# Patient Record
Sex: Male | Born: 1975 | Race: White | Hispanic: No | Marital: Married | Smoking: Former smoker
Health system: Southern US, Community
[De-identification: ages and names within clinical notes are randomized; demographics above are authoritative.]

## PROBLEM LIST (undated history)

## (undated) DIAGNOSIS — K200 Eosinophilic esophagitis: Secondary | ICD-10-CM

## (undated) DIAGNOSIS — B019 Varicella without complication: Secondary | ICD-10-CM

## (undated) DIAGNOSIS — T7840XA Allergy, unspecified, initial encounter: Secondary | ICD-10-CM

## (undated) DIAGNOSIS — K219 Gastro-esophageal reflux disease without esophagitis: Secondary | ICD-10-CM

## (undated) DIAGNOSIS — J302 Other seasonal allergic rhinitis: Secondary | ICD-10-CM

## (undated) DIAGNOSIS — K635 Polyp of colon: Secondary | ICD-10-CM

## (undated) HISTORY — PX: WISDOM TOOTH EXTRACTION: SHX21

## (undated) HISTORY — DX: Gastro-esophageal reflux disease without esophagitis: K21.9

## (undated) HISTORY — DX: Varicella without complication: B01.9

## (undated) HISTORY — DX: Other seasonal allergic rhinitis: J30.2

## (undated) HISTORY — PX: OTHER SURGICAL HISTORY: SHX169

## (undated) HISTORY — DX: Eosinophilic esophagitis: K20.0

## (undated) HISTORY — DX: Polyp of colon: K63.5

## (undated) HISTORY — DX: Allergy, unspecified, initial encounter: T78.40XA

---

## 2009-08-21 HISTORY — PX: COLONOSCOPY: SHX174

## 2014-04-07 ENCOUNTER — Ambulatory Visit (INDEPENDENT_AMBULATORY_CARE_PROVIDER_SITE_OTHER): Payer: BC Managed Care – PPO | Admitting: Family Medicine

## 2014-04-07 ENCOUNTER — Encounter: Payer: Self-pay | Admitting: Family Medicine

## 2014-04-07 VITALS — BP 130/80 | HR 78 | Temp 98.1°F | Ht 70.5 in | Wt 206.0 lb

## 2014-04-07 DIAGNOSIS — L708 Other acne: Secondary | ICD-10-CM

## 2014-04-07 DIAGNOSIS — J309 Allergic rhinitis, unspecified: Secondary | ICD-10-CM

## 2014-04-07 DIAGNOSIS — J3089 Other allergic rhinitis: Secondary | ICD-10-CM | POA: Insufficient documentation

## 2014-04-07 DIAGNOSIS — L709 Acne, unspecified: Secondary | ICD-10-CM | POA: Insufficient documentation

## 2014-04-07 DIAGNOSIS — D126 Benign neoplasm of colon, unspecified: Secondary | ICD-10-CM

## 2014-04-07 DIAGNOSIS — L700 Acne vulgaris: Secondary | ICD-10-CM

## 2014-04-07 DIAGNOSIS — K635 Polyp of colon: Secondary | ICD-10-CM | POA: Insufficient documentation

## 2014-04-07 DIAGNOSIS — K219 Gastro-esophageal reflux disease without esophagitis: Secondary | ICD-10-CM

## 2014-04-07 MED ORDER — MINOCYCLINE HCL 100 MG PO CAPS: 100.0000 mg | ORAL_CAPSULE | Freq: Every day | ORAL | Status: DC

## 2014-04-07 NOTE — Progress Notes (Signed)
   Subjective:    Patient ID: Samuel Ross, male    DOB: August 01, 1976, 38 y.o.   MRN: [id number]  HPI   Patient is here to establish care. Has chronic problems include history of GERD, benign colon polyp, perennial allergies, acne. He has history of generalized alopecia and takes low-dose finasteride. His other medications include minocycline, Singulair, and Xyzal.    Generally very healthy. He has had colonoscopy 5 years ago secondary to hematochezia. This showed rarely one benign polyp. Patient quit smoking 10 years ago with 5-pack-year history. No regular alcohol use. Family history reviewed and as below  Patient has had some recent fleeting right elbow pain. No injury. Location is likely bursa. No swelling. No erythema or warmth. Full range of motion elbow. Pain is not reproducible except for palpitations occasionally  Patient complains of flat feet and has nonspecific diffuse bilateral foot pains. He is considering orthotics.  Past Medical History  Diagnosis Date  . Chicken pox   . GERD (gastroesophageal reflux disease)   . Allergy   . Colon polyps    History reviewed. No pertinent past surgical history.  reports that he has quit smoking. He does not have any smokeless tobacco history on file. He reports that he drinks alcohol. He reports that he does not use illicit drugs. family history includes Arthritis in his mother; Cancer in his paternal grandfather; Heart disease in his father, paternal grandfather, and paternal grandmother; Hyperlipidemia in his maternal grandfather. No Known Allergies    Review of Systems  Constitutional: Negative for fever, chills, appetite change and unexpected weight change.  HENT: Positive for congestion.   Eyes: Negative for visual disturbance.  Respiratory: Negative for shortness of breath.   Cardiovascular: Negative for chest pain.  Gastrointestinal: Negative for abdominal pain.  Genitourinary: Negative for dysuria.  Neurological: Negative for  weakness and numbness.       Objective:   Physical Exam  Constitutional: He appears well-developed and well-nourished.  Cardiovascular: Normal rate and regular rhythm.  Exam reveals no gallop.   No murmur heard. Pulmonary/Chest: Effort normal and breath sounds normal. No respiratory distress. He has no wheezes. He has no rales.  Musculoskeletal:  Right elbow reveals full range of motion. No erythema. No warmth. No localized tenderness. No bursa inflammation. He does not have any tenderness over the lateral or medial epicondylar region          Assessment & Plan:  #1 history of acne. Refill minocycline. Consider CBC at some point later this year #2 perennial allergies. Stable #3 GERD which is controlled with over-the-counter medication  #4 bilateral foot pain. Patient considering orthotics. #5 right elbow pain. Nonfocal exam. Pain not reproducible on exam today. Observe for now. Consider plain films if pain persists.

## 2014-04-07 NOTE — Progress Notes (Signed)
Pre visit review using our clinic review tool, if applicable. No additional management support is needed unless otherwise documented below in the visit note. 

## 2014-07-28 ENCOUNTER — Ambulatory Visit (INDEPENDENT_AMBULATORY_CARE_PROVIDER_SITE_OTHER): Payer: BC Managed Care – PPO | Admitting: Family Medicine

## 2014-07-28 ENCOUNTER — Ambulatory Visit (INDEPENDENT_AMBULATORY_CARE_PROVIDER_SITE_OTHER): Payer: BC Managed Care – PPO

## 2014-07-28 ENCOUNTER — Encounter: Payer: Self-pay | Admitting: Family Medicine

## 2014-07-28 VITALS — BP 132/80 | HR 75 | Temp 98.5°F | Wt 205.0 lb

## 2014-07-28 DIAGNOSIS — M545 Low back pain, unspecified: Secondary | ICD-10-CM

## 2014-07-28 DIAGNOSIS — Z23 Encounter for immunization: Secondary | ICD-10-CM

## 2014-07-28 MED ORDER — NABUMETONE 750 MG PO TABS: 750.0000 mg | ORAL_TABLET | Freq: Two times a day (BID) | ORAL | Status: DC | PRN

## 2014-07-28 MED ORDER — METAXALONE 800 MG PO TABS: 800.0000 mg | ORAL_TABLET | Freq: Three times a day (TID) | ORAL | Status: DC

## 2014-07-28 NOTE — Progress Notes (Signed)
   Subjective:    Patient ID: Samuel Ross, male    DOB: Jan 24, 1976, 38 y.o.   MRN: [id number]  HPI Patient seen for low back pain. Onset a few weeks ago. Denies any specific injury. Location is left lower lumbar. No radiculopathy symptoms. His pain is 9 out of 10 at its worst. Has tried Aleve without much improvement. He has pain which is sometimes sharp and sometimes dull and worse with movement and sometimes with walking. Not with sitting. No radiculopathy symptoms. No numbness or weakness. No loss of bladder or bowel control. Denies any appetite or weight changes. No fevers or chills. No dysuria.  Past Medical History  Diagnosis Date  . Chicken pox   . GERD (gastroesophageal reflux disease)   . Allergy   . Colon polyps    No past surgical history on file.  reports that he has quit smoking. He does not have any smokeless tobacco history on file. He reports that he drinks alcohol. He reports that he does not use illicit drugs. family history includes Arthritis in his mother; Cancer in his paternal grandfather; Heart disease in his father, paternal grandfather, and paternal grandmother; Hyperlipidemia in his maternal grandfather. No Known Allergies    Review of Systems  Constitutional: Negative for fever, activity change and appetite change.  Respiratory: Negative for cough and shortness of breath.   Cardiovascular: Negative for chest pain and leg swelling.  Gastrointestinal: Negative for vomiting and abdominal pain.  Endocrine: Negative for polydipsia and polyuria.  Genitourinary: Negative for dysuria, hematuria and flank pain.  Musculoskeletal: Positive for back pain. Negative for joint swelling.  Neurological: Negative for weakness and numbness.       Objective:   Physical Exam  Constitutional: He is oriented to person, place, and time. He appears well-developed and well-nourished. No distress.  Neck: No thyromegaly present.  Cardiovascular: Normal rate, regular rhythm and  normal heart sounds.   No murmur heard. Pulmonary/Chest: Effort normal and breath sounds normal. No respiratory distress. He has no wheezes. He has no rales.  Musculoskeletal: He exhibits no edema.  Straight leg raises are negative. No localized back tenderness  Neurological: He is alert and oriented to person, place, and time. He has normal reflexes. No cranial nerve deficit.  Skin: No rash noted.          Assessment & Plan:  Low back pain. Suspect musculoskeletal. Nonfocal exam. We gave him some extension stretches. Refill Skelaxin 800 mg 3 times a day when necessary and Relafen 750 milligrams twice a day when necessary. Consider physical therapy if no better in 2-3 weeks

## 2014-07-28 NOTE — Progress Notes (Signed)
Pre visit review using our clinic review tool, if applicable. No additional management support is needed unless otherwise documented below in the visit note. 

## 2014-07-28 NOTE — Patient Instructions (Signed)
Low Back Sprain with Rehab  A sprain is an injury in which a ligament is torn. The ligaments of the lower back are vulnerable to sprains. However, they are strong and require great force to be injured. These ligaments are important for stabilizing the spinal column. Sprains are classified into three categories. Grade 1 sprains cause pain, but the tendon is not lengthened. Grade 2 sprains include a lengthened ligament, due to the ligament being stretched or partially ruptured. With grade 2 sprains there is still function, although the function may be decreased. Grade 3 sprains involve a complete tear of the tendon or muscle, and function is usually impaired. SYMPTOMS   Severe pain in the lower back.  Sometimes, a feeling of a "pop," "snap," or tear, at the time of injury.  Tenderness and sometimes swelling at the injury site.  Uncommonly, bruising (contusion) within 48 hours of injury.  Muscle spasms in the back. CAUSES  Low back sprains occur when a force is placed on the ligaments that is greater than they can handle. Common causes of injury include:  Performing a stressful act while off-balance.  Repetitive stressful activities that involve movement of the lower back.  Direct hit (trauma) to the lower back. RISK INCREASES WITH:  Contact sports (football, wrestling).  Collisions (major skiing accidents).  Sports that require throwing or lifting (baseball, weightlifting).  Sports involving twisting of the spine (gymnastics, diving, tennis, golf).  Poor strength and flexibility.  Inadequate protection.  Previous back injury or surgery (especially fusion). PREVENTION  Wear properly fitted and padded protective equipment.  Warm up and stretch properly before activity.  Allow for adequate recovery between workouts.  Maintain physical fitness:  Strength, flexibility, and endurance.  Cardiovascular fitness.  Maintain a healthy body weight. PROGNOSIS  If treated  properly, low back sprains usually heal with non-surgical treatment. The length of time for healing depends on the severity of the injury.  RELATED COMPLICATIONS   Recurring symptoms, resulting in a chronic problem.  Chronic inflammation and pain in the low back.  Delayed healing or resolution of symptoms, especially if activity is resumed too soon.  Prolonged impairment.  Unstable or arthritic joints of the low back. TREATMENT  Treatment first involves the use of ice and medicine, to reduce pain and inflammation. The use of strengthening and stretching exercises may help reduce pain with activity. These exercises may be performed at home or with a therapist. Severe injuries may require referral to a therapist for further evaluation and treatment, such as ultrasound. Your caregiver may advise that you wear a back brace or corset, to help reduce pain and discomfort. Often, prolonged bed rest results in greater harm then benefit. Corticosteroid injections may be recommended. However, these should be reserved for the most serious cases. It is important to avoid using your back when lifting objects. At night, sleep on your back on a firm mattress, with a pillow placed under your knees. If non-surgical treatment is unsuccessful, surgery may be needed.  MEDICATION   If pain medicine is needed, nonsteroidal anti-inflammatory medicines (aspirin and ibuprofen), or other minor pain relievers (acetaminophen), are often advised.  Do not take pain medicine for 7 days before surgery.  Prescription pain relievers may be given, if your caregiver thinks they are needed. Use only as directed and only as much as you need.  Ointments applied to the skin may be helpful.  Corticosteroid injections may be given by your caregiver. These injections should be reserved for the most serious cases,   because they may only be given a certain number of times. HEAT AND COLD  Cold treatment (icing) should be applied for 10  to 15 minutes every 2 to 3 hours for inflammation and pain, and immediately after activity that aggravates your symptoms. Use ice packs or an ice massage.  Heat treatment may be used before performing stretching and strengthening activities prescribed by your caregiver, physical therapist, or athletic trainer. Use a heat pack or a warm water soak. SEEK MEDICAL CARE IF:   Symptoms get worse or do not improve in 2 to 4 weeks, despite treatment.  You develop numbness or weakness in either leg.  You lose bowel or bladder function.  Any of the following occur after surgery: fever, increased pain, swelling, redness, drainage of fluids, or bleeding in the affected area.  New, unexplained symptoms develop. (Drugs used in treatment may produce side effects.) EXERCISES  RANGE OF MOTION (ROM) AND STRETCHING EXERCISES - Low Back Sprain Most people with lower back pain will find that their symptoms get worse with excessive bending forward (flexion) or arching at the lower back (extension). The exercises that will help resolve your symptoms will focus on the opposite motion.  Your physician, physical therapist or athletic trainer will help you determine which exercises will be most helpful to resolve your lower back pain. Do not complete any exercises without first consulting with your caregiver. Discontinue any exercises which make your symptoms worse, until you speak to your caregiver. If you have pain, numbness or tingling which travels down into your buttocks, leg or foot, the goal of the therapy is for these symptoms to move closer to your back and eventually resolve. Sometimes, these leg symptoms will get better, but your lower back pain may worsen. This is often an indication of progress in your rehabilitation. Be very alert to any changes in your symptoms and the activities in which you participated in the 24 hours prior to the change. Sharing this information with your caregiver will allow him or her to  most efficiently treat your condition. These exercises may help you when beginning to rehabilitate your injury. Your symptoms may resolve with or without further involvement from your physician, physical therapist or athletic trainer. While completing these exercises, remember:   Restoring tissue flexibility helps normal motion to return to the joints. This allows healthier, less painful movement and activity.  An effective stretch should be held for at least 30 seconds.  A stretch should never be painful. You should only feel a gentle lengthening or release in the stretched tissue. FLEXION RANGE OF MOTION AND STRETCHING EXERCISES: STRETCH - Flexion, Single Knee to Chest   Lie on a firm bed or floor with both legs extended in front of you.  Keeping one leg in contact with the floor, bring your opposite knee to your chest. Hold your leg in place by either grabbing behind your thigh or at your knee.  Pull until you feel a gentle stretch in your low back. Hold __________ seconds.  Slowly release your grasp and repeat the exercise with the opposite side. Repeat __________ times. Complete this exercise __________ times per day.  STRETCH - Flexion, Double Knee to Chest  Lie on a firm bed or floor with both legs extended in front of you.  Keeping one leg in contact with the floor, bring your opposite knee to your chest.  Tense your stomach muscles to support your back and then lift your other knee to your chest. Hold your legs   in place by either grabbing behind your thighs or at your knees.  Pull both knees toward your chest until you feel a gentle stretch in your low back. Hold __________ seconds.  Tense your stomach muscles and slowly return one leg at a time to the floor. Repeat __________ times. Complete this exercise __________ times per day.  STRETCH - Low Trunk Rotation  Lie on a firm bed or floor. Keeping your legs in front of you, bend your knees so they are both pointed toward the  ceiling and your feet are flat on the floor.  Extend your arms out to the side. This will stabilize your upper body by keeping your shoulders in contact with the floor.  Gently and slowly drop both knees together to one side until you feel a gentle stretch in your low back. Hold for __________ seconds.  Tense your stomach muscles to support your lower back as you bring your knees back to the starting position. Repeat the exercise to the other side. Repeat __________ times. Complete this exercise __________ times per day  EXTENSION RANGE OF MOTION AND FLEXIBILITY EXERCISES: STRETCH - Extension, Prone on Elbows   Lie on your stomach on the floor, a bed will be too soft. Place your palms about shoulder width apart and at the height of your head.  Place your elbows under your shoulders. If this is too painful, stack pillows under your chest.  Allow your body to relax so that your hips drop lower and make contact more completely with the floor.  Hold this position for __________ seconds.  Slowly return to lying flat on the floor. Repeat __________ times. Complete this exercise __________ times per day.  RANGE OF MOTION - Extension, Prone Press Ups  Lie on your stomach on the floor, a bed will be too soft. Place your palms about shoulder width apart and at the height of your head.  Keeping your back as relaxed as possible, slowly straighten your elbows while keeping your hips on the floor. You may adjust the placement of your hands to maximize your comfort. As you gain motion, your hands will come more underneath your shoulders.  Hold this position __________ seconds.  Slowly return to lying flat on the floor. Repeat __________ times. Complete this exercise __________ times per day.  RANGE OF MOTION- Quadruped, Neutral Spine   Assume a hands and knees position on a firm surface. Keep your hands under your shoulders and your knees under your hips. You may place padding under your knees for  comfort.  Drop your head and point your tailbone toward the ground below you. This will round out your lower back like an angry cat. Hold this position for __________ seconds.  Slowly lift your head and release your tail bone so that your back sags into a large arch, like an old horse.  Hold this position for __________ seconds.  Repeat this until you feel limber in your low back.  Now, find your "sweet spot." This will be the most comfortable position somewhere between the two previous positions. This is your neutral spine. Once you have found this position, tense your stomach muscles to support your low back.  Hold this position for __________ seconds. Repeat __________ times. Complete this exercise __________ times per day.  STRENGTHENING EXERCISES - Low Back Sprain These exercises may help you when beginning to rehabilitate your injury. These exercises should be done near your "sweet spot." This is the neutral, low-back arch, somewhere between fully rounded   and fully arched, that is your least painful position. When performed in this safe range of motion, these exercises can be used for people who have either a flexion or extension based injury. These exercises may resolve your symptoms with or without further involvement from your physician, physical therapist or athletic trainer. While completing these exercises, remember:   Muscles can gain both the endurance and the strength needed for everyday activities through controlled exercises.  Complete these exercises as instructed by your physician, physical therapist or athletic trainer. Increase the resistance and repetitions only as guided.  You may experience muscle soreness or fatigue, but the pain or discomfort you are trying to eliminate should never worsen during these exercises. If this pain does worsen, stop and make certain you are following the directions exactly. If the pain is still present after adjustments, discontinue the  exercise until you can discuss the trouble with your caregiver. STRENGTHENING - Deep Abdominals, Pelvic Tilt   Lie on a firm bed or floor. Keeping your legs in front of you, bend your knees so they are both pointed toward the ceiling and your feet are flat on the floor.  Tense your lower abdominal muscles to press your low back into the floor. This motion will rotate your pelvis so that your tail bone is scooping upwards rather than pointing at your feet or into the floor. With a gentle tension and even breathing, hold this position for __________ seconds. Repeat __________ times. Complete this exercise __________ times per day.  STRENGTHENING - Abdominals, Crunches   Lie on a firm bed or floor. Keeping your legs in front of you, bend your knees so they are both pointed toward the ceiling and your feet are flat on the floor. Cross your arms over your chest.  Slightly tip your chin down without bending your neck.  Tense your abdominals and slowly lift your trunk high enough to just clear your shoulder blades. Lifting higher can put excessive stress on the lower back and does not further strengthen your abdominal muscles.  Control your return to the starting position. Repeat __________ times. Complete this exercise __________ times per day.  STRENGTHENING - Quadruped, Opposite UE/LE Lift   Assume a hands and knees position on a firm surface. Keep your hands under your shoulders and your knees under your hips. You may place padding under your knees for comfort.  Find your neutral spine and gently tense your abdominal muscles so that you can maintain this position. Your shoulders and hips should form a rectangle that is parallel with the floor and is not twisted.  Keeping your trunk steady, lift your right hand no higher than your shoulder and then your left leg no higher than your hip. Make sure you are not holding your breath. Hold this position for __________ seconds.  Continuing to keep  your abdominal muscles tense and your back steady, slowly return to your starting position. Repeat with the opposite arm and leg. Repeat __________ times. Complete this exercise __________ times per day.  STRENGTHENING - Abdominals and Quadriceps, Straight Leg Raise   Lie on a firm bed or floor with both legs extended in front of you.  Keeping one leg in contact with the floor, bend the other knee so that your foot can rest flat on the floor.  Find your neutral spine, and tense your abdominal muscles to maintain your spinal position throughout the exercise.  Slowly lift your straight leg off the floor about 6 inches for a count   of 15, making sure to not hold your breath.  Still keeping your neutral spine, slowly lower your leg all the way to the floor. Repeat this exercise with each leg __________ times. Complete this exercise __________ times per day. POSTURE AND BODY MECHANICS CONSIDERATIONS - Low Back Sprain Keeping correct posture when sitting, standing or completing your activities will reduce the stress put on different body tissues, allowing injured tissues a chance to heal and limiting painful experiences. The following are general guidelines for improved posture. Your physician or physical therapist will provide you with any instructions specific to your needs. While reading these guidelines, remember:  The exercises prescribed by your provider will help you have the flexibility and strength to maintain correct postures.  The correct posture provides the best environment for your joints to work. All of your joints have less wear and tear when properly supported by a spine with good posture. This means you will experience a healthier, less painful body.  Correct posture must be practiced with all of your activities, especially prolonged sitting and standing. Correct posture is as important when doing repetitive low-stress activities (typing) as it is when doing a single heavy-load  activity (lifting). RESTING POSITIONS Consider which positions are most painful for you when choosing a resting position. If you have pain with flexion-based activities (sitting, bending, stooping, squatting), choose a position that allows you to rest in a less flexed posture. You would want to avoid curling into a fetal position on your side. If your pain worsens with extension-based activities (prolonged standing, working overhead), avoid resting in an extended position such as sleeping on your stomach. Most people will find more comfort when they rest with their spine in a more neutral position, neither too rounded nor too arched. Lying on a non-sagging bed on your side with a pillow between your knees, or on your back with a pillow under your knees will often provide some relief. Keep in mind, being in any one position for a prolonged period of time, no matter how correct your posture, can still lead to stiffness. PROPER SITTING POSTURE In order to minimize stress and discomfort on your spine, you must sit with correct posture. Sitting with good posture should be effortless for a healthy body. Returning to good posture is a gradual process. Many people can work toward this most comfortably by using various supports until they have the flexibility and strength to maintain this posture on their own. When sitting with proper posture, your ears will fall over your shoulders and your shoulders will fall over your hips. You should use the back of the chair to support your upper back. Your lower back will be in a neutral position, just slightly arched. You may place a small pillow or folded towel at the base of your lower back for  support.  When working at a desk, create an environment that supports good, upright posture. Without extra support, muscles tire, which leads to excessive strain on joints and other tissues. Keep these recommendations in mind: CHAIR:  A chair should be able to slide under your desk  when your back makes contact with the back of the chair. This allows you to work closely.  The chair's height should allow your eyes to be level with the upper part of your monitor and your hands to be slightly lower than your elbows. BODY POSITION  Your feet should make contact with the floor. If this is not possible, use a foot rest.  Keep your   ears over your shoulders. This will reduce stress on your neck and low back. INCORRECT SITTING POSTURES  If you are feeling tired and unable to assume a healthy sitting posture, do not slouch or slump. This puts excessive strain on your back tissues, causing more damage and pain. Healthier options include:  Using more support, like a lumbar pillow.  Switching tasks to something that requires you to be upright or walking.  Talking a brief walk.  Lying down to rest in a neutral-spine position. PROLONGED STANDING WHILE SLIGHTLY LEANING FORWARD  When completing a task that requires you to lean forward while standing in one place for a long time, place either foot up on a stationary 2-4 inch high object to help maintain the best posture. When both feet are on the ground, the lower back tends to lose its slight inward curve. If this curve flattens (or becomes too large), then the back and your other joints will experience too much stress, tire more quickly, and can cause pain. CORRECT STANDING POSTURES Proper standing posture should be assumed with all daily activities, even if they only take a few moments, like when brushing your teeth. As in sitting, your ears should fall over your shoulders and your shoulders should fall over your hips. You should keep a slight tension in your abdominal muscles to brace your spine. Your tailbone should point down to the ground, not behind your body, resulting in an over-extended swayback posture.  INCORRECT STANDING POSTURES  Common incorrect standing postures include a forward head, locked knees and/or an excessive  swayback. WALKING Walk with an upright posture. Your ears, shoulders and hips should all line-up. PROLONGED ACTIVITY IN A FLEXED POSITION When completing a task that requires you to bend forward at your waist or lean over a low surface, try to find a way to stabilize 3 out of 4 of your limbs. You can place a hand or elbow on your thigh or rest a knee on the surface you are reaching across. This will provide you more stability, so that your muscles do not tire as quickly. By keeping your knees relaxed, or slightly bent, you will also reduce stress across your lower back. CORRECT LIFTING TECHNIQUES DO :  Assume a wide stance. This will provide you more stability and the opportunity to get as close as possible to the object which you are lifting.  Tense your abdominals to brace your spine. Bend at the knees and hips. Keeping your back locked in a neutral-spine position, lift using your leg muscles. Lift with your legs, keeping your back straight.  Test the weight of unknown objects before attempting to lift them.  Try to keep your elbows locked down at your sides in order get the best strength from your shoulders when carrying an object.  Always ask for help when lifting heavy or awkward objects. INCORRECT LIFTING TECHNIQUES DO NOT:   Lock your knees when lifting, even if it is a small object.  Bend and twist. Pivot at your feet or move your feet when needing to change directions.  Assume that you can safely pick up even a paperclip without proper posture. Document Released: 08/07/2005 Document Revised: 10/30/2011 Document Reviewed: 11/19/2008 [employer] Patient Information 2015 [employer]. This information is not intended to replace advice given to you by your health care provider. Make sure you discuss any questions you have with your health care provider.  

## 2014-08-07 ENCOUNTER — Other Ambulatory Visit: Payer: Self-pay | Admitting: Family Medicine

## 2015-02-17 ENCOUNTER — Ambulatory Visit (INDEPENDENT_AMBULATORY_CARE_PROVIDER_SITE_OTHER): Payer: BLUE CROSS/BLUE SHIELD | Admitting: Family Medicine

## 2015-02-17 VITALS — BP 122/74 | HR 92 | Temp 98.7°F | Resp 17 | Ht 69.5 in | Wt 195.0 lb

## 2015-02-17 DIAGNOSIS — J029 Acute pharyngitis, unspecified: Secondary | ICD-10-CM | POA: Diagnosis not present

## 2015-02-17 DIAGNOSIS — J020 Streptococcal pharyngitis: Secondary | ICD-10-CM | POA: Diagnosis not present

## 2015-02-17 LAB — POCT RAPID STREP A (OFFICE): Rapid Strep A Screen: POSITIVE — AB

## 2015-02-17 MED ORDER — AMOXICILLIN 875 MG PO TABS: 875.0000 mg | ORAL_TABLET | Freq: Two times a day (BID) | ORAL | Status: DC

## 2015-02-17 MED ORDER — MAGIC MOUTHWASH W/LIDOCAINE: 5.0000 mL | Freq: Four times a day (QID) | ORAL | Status: DC | PRN

## 2015-02-17 NOTE — Patient Instructions (Signed)

## 2015-02-17 NOTE — Progress Notes (Signed)
Chief Complaint:  Chief Complaint  Patient presents with  . Sore Throat    HPI: Samuel Ross is a 39 y.o. male who is here for  sore throat, subjective fevers and chills last night, symptoms started 4 days ago since Sunday after he returned from vacation for a from [place]. He has had also a swollen gland on the left side. His throat feels worse on the left side. No sick contacts. He has a slightly productive cough. He has throat pain and has difficulty drinking and eating. He is not taken anything for this except his allergy medications. He thought it was allergies and has switched his normal allergy medication to something different. It was not effective.  Past Medical History  Diagnosis Date  . Chicken pox   . GERD (gastroesophageal reflux disease)   . Allergy   . Colon polyps    No past surgical history on file. History   Social History  . Marital Status: Married    Spouse Name: N/A  . Number of Children: N/A  . Years of Education: N/A   Social History Main Topics  . 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 Smokeless tobacco: Not on file  . Alcohol Use: Yes  . Drug Use: No  . Sexual Activity: Not on file   Other Topics Concern  . None   Social History Narrative   Family History  Problem Relation Age of Onset  . Arthritis Mother   . Heart disease Father   . Hyperlipidemia Maternal Grandfather   . Heart disease Paternal Grandmother   . Cancer Paternal Grandfather     prostate  . Heart disease Paternal Grandfather    No Known Allergies Prior to Admission medications   Medication Sig Start Date End Date Taking? Authorizing Provider  finasteride (PROSCAR) 5 MG tablet Take 7.5 mg by mouth daily. Take one and one half tablet daily 03/17/14  Yes Historical Provider, MD  levocetirizine (XYZAL) 5 MG tablet Take 5 mg by mouth every evening.  03/31/14  Yes Historical Provider, MD  montelukast (SINGULAIR) 10 MG tablet Take 10 mg by mouth every evening.  03/31/14  Yes  Historical Provider, MD     ROS: The patient denies night sweats, unintentional weight loss, chest pain, palpitations, wheezing, dyspnea on exertion, nausea, vomiting, abdominal pain, dysuria, hematuria, melena, numbness, weakness, or tingling.   All other systems have been reviewed and were otherwise negative with the exception of those mentioned in the HPI and as above.    PHYSICAL EXAM: Filed Vitals:   02/17/15 0828  BP: 122/74  Pulse: 92  Temp: 98.7 F (37.1 C)  Resp: 17   Filed Vitals:   02/17/15 0828  Height: 5' 9.5" (1.765 m)  Weight: 195 lb (88.451 kg)   Body mass index is 28.39 kg/(m^2).   General: Alert, no acute distress HEENT:  Normocephalic, atraumatic, oropharynx patent. EOMI, PERRLA No exudates, erythematous tonsils, positive anterior cervical LAD. TMs were normal, no sinus tenderness. Cardiovascular:  Regular rate and rhythm, no rubs murmurs or gallops.  No Carotid bruits, radial pulse intact. No pedal edema.  Respiratory: Clear to auscultation bilaterally.  No wheezes, rales, or rhonchi.  No cyanosis, no use of accessory musculature GI: No organomegaly, abdomen is soft and non-tender, positive bowel sounds.  No masses. Skin: No rashes. Neurologic: Facial musculature symmetric. Psychiatric: Patient is appropriate throughout our interaction. Lymphatic: Positive cervical lymphadenopathy Musculoskeletal: Gait intact.   LABS: Results for orders placed or performed in visit  on 02/17/15  POCT rapid strep A  Result Value Ref Range   Rapid Strep A Screen Positive (A) Negative     EKG/XRAY:   Primary read interpreted by Dr. Marin Comment at [facility].   ASSESSMENT/PLAN: Encounter Diagnoses  Name Primary?  . Acute pharyngitis, unspecified pharyngitis type Yes  . Streptococcal sore throat    Prescribed amoxicillin, Magic mouthwash with lidocaine Follow-up when necessary  Gross sideeffects, risk and benefits, and alternatives of medications d/w patient. Patient is aware  that all medications have potential sideeffects and we are unable to predict every sideeffect or drug-drug interaction that may occur.  Makayla Confer, [place], DO 02/17/2015 9:30 AM

## 2015-11-10 ENCOUNTER — Telehealth: Payer: Self-pay | Admitting: Family Medicine

## 2015-11-10 NOTE — Telephone Encounter (Signed)
Pt has an appt on 11-12-15 and would like finasteride 5 mg #30 send to [facility] [address]

## 2015-11-11 MED ORDER — FINASTERIDE 5 MG PO TABS: 7.5000 mg | ORAL_TABLET | Freq: Every day | ORAL | Status: DC

## 2015-11-11 NOTE — Telephone Encounter (Signed)
Medication refilled for 1 month

## 2015-11-12 ENCOUNTER — Ambulatory Visit (INDEPENDENT_AMBULATORY_CARE_PROVIDER_SITE_OTHER): Payer: BLUE CROSS/BLUE SHIELD | Admitting: Family Medicine

## 2015-11-12 ENCOUNTER — Encounter: Payer: Self-pay | Admitting: Family Medicine

## 2015-11-12 VITALS — BP 120/80 | HR 116 | Temp 99.4°F | Ht 69.5 in | Wt 199.5 lb

## 2015-11-12 DIAGNOSIS — L918 Other hypertrophic disorders of the skin: Secondary | ICD-10-CM

## 2015-11-12 DIAGNOSIS — J309 Allergic rhinitis, unspecified: Secondary | ICD-10-CM | POA: Diagnosis not present

## 2015-11-12 DIAGNOSIS — J3089 Other allergic rhinitis: Secondary | ICD-10-CM

## 2015-11-12 DIAGNOSIS — L659 Nonscarring hair loss, unspecified: Secondary | ICD-10-CM

## 2015-11-12 DIAGNOSIS — R509 Fever, unspecified: Secondary | ICD-10-CM

## 2015-11-12 LAB — POCT INFLUENZA A/B
INFLUENZA A, POC: NEGATIVE
INFLUENZA B, POC: NEGATIVE

## 2015-11-12 NOTE — Progress Notes (Signed)
   Subjective:    Patient ID: Samuel Ross, male    DOB: May 19, 1976, 40 y.o.   MRN: [id number]  HPI  Patient seen for medical follow-up and also for acute febrile illness.  Onset yesterday of low-grade fever along with some body aches, headache, nasal congestion, mild sore throat. Minimal cough. No nausea or vomiting. No skin rash. Fever improved following Aleve. No sick contacts.     Couple small skin tags one on the right arm 1 left arm and just under the right eye. Would like to have treated at some point.   History of perennial allergies. He takes levocetirizine  Also on Singulair and allergy symptoms are generally fairly well controlled.   Patient has been on low-dose finasteride for several years for hair loss. Currently takes finasteride 5 mg one third of the tablet once daily.  Past Medical History  Diagnosis Date  . Chicken pox   . GERD (gastroesophageal reflux disease)   . Allergy   . Colon polyps    No past surgical history on file.  reports that he has quit smoking. He does not have any smokeless tobacco history on file. He reports that he drinks alcohol. He reports that he does not use illicit drugs. family history includes Arthritis in his mother; Cancer in his paternal grandfather; Heart disease in his father, paternal grandfather, and paternal grandmother; Hyperlipidemia in his maternal grandfather. No Known Allergies      Review of Systems  Constitutional: Positive for fever, chills and fatigue.  HENT: Positive for congestion.   Respiratory: Positive for cough. Negative for shortness of breath and wheezing.   Gastrointestinal: Negative for nausea, vomiting and abdominal pain.  Neurological: Positive for headaches.       Objective:   Physical Exam  Constitutional: He appears well-developed and well-nourished.  HENT:  Right Ear: External ear normal.  Left Ear: External ear normal.  Mouth/Throat: Oropharynx is clear and moist.  Neck: Neck supple.    Cardiovascular: Normal rate and regular rhythm.   Pulmonary/Chest: Effort normal and breath sounds normal. No respiratory distress. He has no wheezes. He has no rales.  Lymphadenopathy:    He has no cervical adenopathy.  Skin:  He has a few small benign-appearing skin tags including one upper inner arm and also just under the right eye          Assessment & Plan:   #1 febrile illness. Rule out influenza. Rapid screen obtained-negative.  Stay well hydrated and continue with NSAIDS.     #2 benign-appearing skin tags. Offered treatment with liquid nitrogen and he wishes to wait at this time   #3 perennial allergies. Stable symptomatically   #4 history of hair loss. Continue finasteride low dosage

## 2015-11-12 NOTE — Progress Notes (Signed)
Pre visit review using our clinic review tool, if applicable. No additional management support is needed unless otherwise documented below in the visit note. 

## 2015-11-26 ENCOUNTER — Encounter: Payer: Self-pay | Admitting: Family Medicine

## 2015-11-26 ENCOUNTER — Ambulatory Visit (INDEPENDENT_AMBULATORY_CARE_PROVIDER_SITE_OTHER): Payer: BLUE CROSS/BLUE SHIELD | Admitting: Family Medicine

## 2015-11-26 VITALS — BP 130/90 | HR 72 | Temp 98.8°F | Wt 199.0 lb

## 2015-11-26 DIAGNOSIS — L918 Other hypertrophic disorders of the skin: Secondary | ICD-10-CM

## 2015-11-26 NOTE — Progress Notes (Signed)
   Subjective:    Patient ID: Samuel Ross, male    DOB: 24-Apr-1976, 40 y.o.   MRN: [id number]  HPI Patient here for skin tag removal-only. This is a procedure visit only.  Skin tags recently noted on exam and pt requesting treatment.  He has several skin tags in both axillary regions which are very irritating.These are slowly growing in size. Has a family history of skin tags. Never treated previously.  Past Medical History  Diagnosis Date  . Chicken pox   . GERD (gastroesophageal reflux disease)   . Allergy   . Colon polyps    No past surgical history on file.  reports that he has quit smoking. He does not have any smokeless tobacco history on file. He reports that he drinks alcohol. He reports that he does not use illicit drugs. family history includes Arthritis in his mother; Cancer in his paternal grandfather; Heart disease in his father, paternal grandfather, and paternal grandmother; Hyperlipidemia in his maternal grandfather. No Known Allergies    Review of Systems  Skin: Negative for rash.  Hematological: Negative for adenopathy.       Objective:   Physical Exam  Constitutional: He appears well-developed and well-nourished.  Cardiovascular: Normal rate and regular rhythm.   Pulmonary/Chest: Effort normal and breath sounds normal. No respiratory distress. He has no wheezes. He has no rales.  Skin:  Patient has several benign appearing skin tags right and left axillary region.           Assessment & Plan:    Multiple skin tags axillary region bilaterally- all appear benign. We discussed options for treatment including liquid nitrogen and patient consented. Total of 6 skin tags left axilla and 5 right axilla were treated with liquid nitrogen. Patient tolerated well. Keep clean with soap and water. Follow-up as needed

## 2015-11-26 NOTE — Progress Notes (Signed)
Pre visit review using our clinic review tool, if applicable. No additional management support is needed unless otherwise documented below in the visit note. 

## 2015-11-26 NOTE — Patient Instructions (Signed)
Keep treated areas clean with soap and water.

## 2016-03-15 ENCOUNTER — Other Ambulatory Visit: Payer: Self-pay | Admitting: Family Medicine

## 2016-04-29 ENCOUNTER — Other Ambulatory Visit: Payer: Self-pay | Admitting: Family Medicine

## 2016-07-31 ENCOUNTER — Other Ambulatory Visit: Payer: Self-pay | Admitting: Family Medicine

## 2016-08-21 HISTORY — PX: UPPER GASTROINTESTINAL ENDOSCOPY: SHX188

## 2016-10-06 ENCOUNTER — Other Ambulatory Visit: Payer: Self-pay | Admitting: Family Medicine

## 2016-11-07 ENCOUNTER — Other Ambulatory Visit: Payer: Self-pay | Admitting: Family Medicine

## 2016-12-05 ENCOUNTER — Ambulatory Visit (INDEPENDENT_AMBULATORY_CARE_PROVIDER_SITE_OTHER): Payer: BLUE CROSS/BLUE SHIELD | Admitting: Family Medicine

## 2016-12-05 ENCOUNTER — Encounter: Payer: Self-pay | Admitting: Family Medicine

## 2016-12-05 VITALS — BP 120/80 | HR 82 | Temp 98.9°F | Wt 199.3 lb

## 2016-12-05 DIAGNOSIS — R1310 Dysphagia, unspecified: Secondary | ICD-10-CM

## 2016-12-05 NOTE — Progress Notes (Signed)
Subjective:     Patient ID: Samuel Ross, male   DOB: 28-Feb-1976, 41 y.o.   MRN: [id number]  HPI   Patient relates about 4 separate episodes of dysphagia that occurred since early March of this year.. He has had only rare reflux symptoms in the past. He recalls first episode was when he was drinking some wine and eating some steak and cheese. He had to regurgitate that food and then was able to continue eating later. He's had a few other similar episodes twice with steak and once with pork. Each time he had regurgitate and had difficulty swallowing saliva-or any other liquids..  Most recent episode occurred last night. He tried taking some Pepto-Bismol but this came back up. He tried Pepcid and Tums. He had sensation of "pressure" around his mid esophageal region and this eventually eased up after several hours. He has not had any appetite or weight changes. No pain with swallowing.  Does not take any regular antacids.  Past Medical History:  Diagnosis Date  . Allergy   . Chicken pox   . Colon polyps   . GERD (gastroesophageal reflux disease)    No past surgical history on file.  reports that he has quit smoking. He has never used smokeless tobacco. He reports that he drinks alcohol. He reports that he does not use drugs. family history includes Arthritis in his mother; Cancer in his paternal grandfather; Heart disease in his father, paternal grandfather, and paternal grandmother; Hyperlipidemia in his maternal grandfather. No Known Allergies   Review of Systems  Constitutional: Negative for appetite change and unexpected weight change.  HENT: Positive for trouble swallowing. Negative for sore throat and voice change.   Respiratory: Negative for shortness of breath.   Cardiovascular: Negative for chest pain.  Gastrointestinal: Negative for abdominal pain.  Hematological: Negative for adenopathy.       Objective:   Physical Exam  Constitutional: He appears well-developed and  well-nourished.  HENT:  Mouth/Throat: Oropharynx is clear and moist.  Cardiovascular: Normal rate and regular rhythm.  Exam reveals no gallop.   No murmur heard. Pulmonary/Chest: Effort normal and breath sounds normal. No respiratory distress. He has no wheezes. He has no rales. He exhibits no tenderness.  Abdominal: Soft. Bowel sounds are normal. He exhibits no distension and no mass. There is no tenderness. There is no rebound and no guarding.       Assessment:     Progressive solid food dysphagia over the past month. He does not have any red flags such as appetite or weight changes. Needs further evaluation    Plan:     -Recommend he go ahead and start over-the-counter Nexium or Prilosec -Set up GI referral -Eats slowly and chew food well -Avoid meats or other hard to chew foods  Eulas Post MD [facility]

## 2016-12-05 NOTE — Progress Notes (Signed)
Pre visit review using our clinic review tool, if applicable. No additional management support is needed unless otherwise documented below in the visit note. 

## 2016-12-05 NOTE — Patient Instructions (Signed)
Dysphagia Dysphagia is trouble swallowing. This condition occurs when solids and liquids stick in a person's throat on the way down to the stomach, or when food takes longer to get to the stomach. You may have problems swallowing food, liquids, or both. You may also have pain while trying to swallow. It may take you more time and effort to swallow something. What are the causes? This condition is caused by:  Problems with the muscles. They may make it difficult for you to move food and liquids through the tube that connects your mouth to your stomach (esophagus). You may have ulcers, scar tissue, or inflammation that blocks the normal passage of food and liquids. Causes of these problems include:  Acid reflux from your stomach into your esophagus (gastroesophageal reflux).  Infections.  Radiation treatment for cancer.  Medicines taken without enough fluids to wash them down into your stomach.  Nerve problems. These prevent signals from being sent to the muscles of your esophagus to squeeze (contract) and move what you swallow down to your stomach.  Globus pharyngeus. This is a common problem that involves feeling like something is stuck in the throat or a sense of trouble with swallowing even though nothing is wrong with the swallowing passages.  Stroke. This can affect the nerves and make it difficult to swallow.  Certain conditions, such as cerebral palsy or Parkinson disease. What are the signs or symptoms? Common symptoms of this condition include:  A feeling that solids or liquids are stuck in your throat on the way down to the stomach.  Food taking too long to get to the stomach. Other symptoms include:  Food moving back from your stomach to your mouth (regurgitation).  Noises coming from your throat.  Chest discomfort with swallowing.  A feeling of fullness when swallowing.  Drooling, especially when the throat is blocked.  Pain while  swallowing.  Heartburn.  Coughing or gagging while trying to swallow. How is this diagnosed? This condition is diagnosed by:  Barium X-ray. In this test, you swallow a white substance (contrast medium)that sticks to the inside of your esophagus. X-ray images are then taken.  Endoscopy. In this test, a flexible telescope is inserted down your throat to look at your esophagus and your stomach.  CT scans and MRI. How is this treated? Treatment for dysphagia depends on the cause of the condition:  If the dysphagia is caused by acid reflux or infection, medicines may be used. They may include antibiotics and heartburn medicines.  If the dysphagia is caused by problems with your muscles, swallowing therapy may be used to help you strengthen your swallowing muscles. You may have to do specific exercises to strengthen the muscles or stretch them.  If the dysphagia is caused by a blockage or mass, procedures to remove the blockage may be done. You may need surgery and a feeding tube. You may need to make diet changes. Ask your health care provider for specific instructions. Follow these instructions at home: Eating and drinking   Try to eat soft food that is easier to swallow.  Follow any diet changes as told by your health care provider.  Cut your food into small pieces and eat slowly.  Eat and drink only when you are sitting upright.  Do not drink alcohol or caffeine. If you need help quitting, ask your health care provider. General instructions   Check your weight every day to make sure you are not losing weight.  Take over-the-counter and prescription medicines  only as told by your health care provider.  If you were prescribed an antibiotic medicine, take it as told by your health care provider. Do not stop taking the antibiotic even if you start to feel better.  Do not use any products that contain nicotine or tobacco, such as cigarettes and e-cigarettes. If you need help  quitting, ask your health care provider.  Keep all follow-up visits as told by your health care provider. This is important. Contact a health care provider if:  You lose weight because you cannot swallow.  You cough when you drink liquids (aspiration).  You cough up partially digested food. Get help right away if:  You cannot swallow your saliva.  You have shortness of breath or a fever, or both.  You have a hoarse voice and also have trouble swallowing. Summary  Dysphagia is trouble swallowing. This condition occurs when solids and liquids stick in a person's throat on the way down to the stomach, or when food takes longer to get to the stomach.  Dysphagia has many possible causes and symptoms.  Treatment for dysphagia depends on the cause of the condition. This information is not intended to replace advice given to you by your health care provider. Make sure you discuss any questions you have with your health care provider. Document Released: 08/04/2000 Document Revised: 07/27/2016 Document Reviewed: 07/27/2016 Elsevier Interactive Patient Education  2017 [employer].  Consider OTC Nexium or Prilosec 20 mg once daily We will set up GI referral. Eat slowly, chew food well, and avoid hard to chew meats such as steak.

## 2016-12-06 ENCOUNTER — Other Ambulatory Visit: Payer: Self-pay | Admitting: Family Medicine

## 2016-12-06 ENCOUNTER — Encounter: Payer: Self-pay | Admitting: Nurse Practitioner

## 2016-12-13 ENCOUNTER — Encounter: Payer: Self-pay | Admitting: Nurse Practitioner

## 2016-12-13 ENCOUNTER — Ambulatory Visit (INDEPENDENT_AMBULATORY_CARE_PROVIDER_SITE_OTHER): Payer: BLUE CROSS/BLUE SHIELD | Admitting: Nurse Practitioner

## 2016-12-13 VITALS — BP 120/72 | HR 80 | Ht 69.5 in | Wt 201.2 lb

## 2016-12-13 DIAGNOSIS — R1310 Dysphagia, unspecified: Secondary | ICD-10-CM | POA: Diagnosis not present

## 2016-12-13 NOTE — Patient Instructions (Signed)
If you are age 43 or older, your body mass index should be between 23-30. Your Body mass index is 29.29 kg/m. If this is out of the aforementioned range listed, please consider follow up with your Primary Care Provider.  If you are age 26 or younger, your body mass index should be between 19-25. Your Body mass index is 29.29 kg/m. If this is out of the aformentioned range listed, please consider follow up with your Primary Care Provider.   You have been scheduled for an endoscopy. Please follow written instructions given to you at your visit today. If you use inhalers (even only as needed), please bring them with you on the day of your procedure. Your physician has requested that you go to www.startemmi.com and enter the access code given to you at your visit today. This web site gives a general overview about your procedure. However, you should still follow specific instructions given to you by our office regarding your preparation for the procedure.   Thank you for choosing me and [facility].  Tye Savoy, NP

## 2016-12-13 NOTE — Progress Notes (Signed)
HPI:  Patient is a 41 year old male referred by PCP, Dr. Carolann Littler, for evaluation of solid food dysphagia. Patient had one episode of solid food dysphagia a year ago. He had a second episode within the last couple of months and a third episode earlier this month. Each episode of dysphagia has been when swallowing meat . Each time patient notices a build up of thick secretions while eating.  Following the the most recent recent episode patient felt a lump and pressure in his throat for several hours.Marland KitchenHe regurgitated saliva. Tried water and Pepto but ultimately vomited up both. Saw PCP for 4/17,  started on Prilosec. He is eating cautiously now.  No weight loss, no abdominal pain or other GI symptoms . Years ago patient had frequent heartburn but over the last 5 years he has infrequently needed Tums.    Past Medical History:  Diagnosis Date  . Allergy   . Chicken pox   . Colon polyps   . GERD (gastroesophageal reflux disease)     Past Surgical History:  Procedure Laterality Date  . none     Family History  Problem Relation Age of Onset  . Arthritis Mother   . Diverticulitis Mother   . Heart disease Father   . Stroke Father   . Atrial fibrillation Father   . Hyperlipidemia Maternal Grandfather   . Diabetes Maternal Grandfather   . Heart disease Paternal Grandmother   . Heart disease Paternal Grandfather   . Prostate cancer Paternal Grandfather   . Colon cancer Neg Hx   . Stomach cancer Neg Hx   . Pancreatic cancer Neg Hx   . Esophageal cancer Neg Hx    Social History  Substance Use Topics  . 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 Smokeless tobacco: Never Used  . Alcohol use Yes     Comment: 1 beverage daily   Current Outpatient Prescriptions  Medication Sig Dispense Refill  . finasteride (PROSCAR) 5 MG tablet TAKE 1/3 TABLET BY MOUTH ONCE A DAY 30 tablet 0  . levocetirizine (XYZAL) 5 MG tablet Take 5 mg by mouth every evening.     . mometasone (NASONEX) 50 MCG/ACT  nasal spray Place 2 sprays into the nose daily.    . montelukast (SINGULAIR) 10 MG tablet Take 10 mg by mouth every evening.     Marland Kitchen omeprazole (PRILOSEC) 20 MG capsule Take 20 mg by mouth daily.     No current facility-administered medications for this visit.    No Known Allergies   Review of Systems: All systems reviewed and negative except where noted in HPI.   Physical Exam: BP 120/72   Pulse 80   Ht 5' 9.5" (1.765 m)   Wt 201 lb 3.2 oz (91.3 kg)   BMI 29.29 kg/m  Constitutional:  Well-developed,white male in no acute distress. Psychiatric: Normal mood and affect. Behavior is normal. EENT:  Conjunctivae are normal. No scleral icterus. Neck supple.  Cardiovascular: Normal rate, regular rhythm.  Pulmonary/chest: Effort normal and breath sounds normal. No wheezing, rales or rhonchi. Abdominal: Soft, nondistended, nontender. Bowel sounds active throughout. There are no masses palpable. No hepatomegaly. Extremities: no edema Lymphadenopathy: No cervical adenopathy noted. Neurological: Alert and oriented to person place and time. Skin: Skin is warm and dry. No rashes noted.   ASSESSMENT AND PLAN:  41 year old healthy male with intermittent solid food dysphagia. He has had three episodes over the last year, two of which were recent. This last episode, earlier this month,  resulted in patient nearly being unable to manage secretions.Need to evaluate for eosinophilic esophagitis or stricture. -Continue PPI started by PCP -for further evaluation patient will be scheduled for  EGD. The risks and benefits of EGD were discussed and the patient agrees to proceed. Advised patient to eat small bites, chew well with liquids in between bites to avoid food impaction.    Tye Savoy, NP  12/13/2016, 3:17 PM  Cc:  Eulas Post, MD

## 2016-12-14 NOTE — Progress Notes (Signed)
Reviewed and agree with management plan.  Ariston Grandison T. Tanishka Drolet, MD FACG 

## 2016-12-15 ENCOUNTER — Encounter: Payer: BLUE CROSS/BLUE SHIELD | Admitting: Gastroenterology

## 2016-12-28 ENCOUNTER — Encounter: Payer: Self-pay | Admitting: Gastroenterology

## 2017-01-10 ENCOUNTER — Encounter: Payer: Self-pay | Admitting: Gastroenterology

## 2017-01-10 ENCOUNTER — Ambulatory Visit (AMBULATORY_SURGERY_CENTER): Payer: BLUE CROSS/BLUE SHIELD | Admitting: Gastroenterology

## 2017-01-10 VITALS — BP 111/71 | HR 69 | Temp 98.4°F | Resp 14 | Ht 69.5 in | Wt 201.0 lb

## 2017-01-10 DIAGNOSIS — R1310 Dysphagia, unspecified: Secondary | ICD-10-CM

## 2017-01-10 DIAGNOSIS — K228 Other specified diseases of esophagus: Secondary | ICD-10-CM | POA: Diagnosis not present

## 2017-01-10 DIAGNOSIS — K222 Esophageal obstruction: Secondary | ICD-10-CM | POA: Diagnosis not present

## 2017-01-10 DIAGNOSIS — K229 Disease of esophagus, unspecified: Secondary | ICD-10-CM | POA: Diagnosis not present

## 2017-01-10 DIAGNOSIS — K2950 Unspecified chronic gastritis without bleeding: Secondary | ICD-10-CM | POA: Diagnosis not present

## 2017-01-10 MED ORDER — SODIUM CHLORIDE 0.9 % IV SOLN: 500.0000 mL | INTRAVENOUS | Status: DC

## 2017-01-10 NOTE — Progress Notes (Signed)
A and O x3. Report to RN. Tolerated MAC anesthesia well.Teeth unchanged after procedure.

## 2017-01-10 NOTE — Progress Notes (Signed)
Called to room to assist during endoscopic procedure.  Patient ID and intended procedure confirmed with present staff. Received instructions for my participation in the procedure from the performing physician.  

## 2017-01-10 NOTE — Op Note (Signed)
[facility] Patient Name: Samuel Ross Procedure Date: 01/10/2017 9:46 AM MRN: [id number] Endoscopist: Ladene Artist , MD Age: 41 Referring MD:  Date of Birth: [birth year] Gender: Male Account #: [id number] Procedure:                Upper GI endoscopy Indications:              Dysphagia Medicines:                Monitored Anesthesia Care Procedure:                Pre-Anesthesia Assessment:                           - Prior to the procedure, a History and Physical                            was performed, and patient medications and                            allergies were reviewed. The patient's tolerance of                            previous anesthesia was also reviewed. The risks                            and benefits of the procedure and the sedation                            options and risks were discussed with the patient.                            All questions were answered, and informed consent                            was obtained. Prior Anticoagulants: The patient has                            taken no previous anticoagulant or antiplatelet                            agents. ASA Grade Assessment: II - A patient with                            mild systemic disease. After reviewing the risks                            and benefits, the patient was deemed in                            satisfactory condition to undergo the procedure.                           After obtaining informed consent, the endoscope was  passed under direct vision. Throughout the                            procedure, the patient's blood pressure, pulse, and                            oxygen saturations were monitored continuously. The                            Endoscope was introduced through the mouth, and                            advanced to the second part of duodenum. The upper                            GI endoscopy was accomplished without difficulty.                             The patient tolerated the procedure well. Scope In: Scope Out: Findings:                 Diffuse mild inflammation was found in the mid                            esophagus and in the distal esophagus. Linear                            furrows. Biopsies were taken with a cold forceps                            for histology.                           One moderate benign-appearing, intrinsic stenosis                            was found at the gastroesophageal junction. This                            measured 1.2 cm (inner diameter) and was traversed.                            A guidewire was placed and the scope was withdrawn.                            Dilation was performed with a Savary dilator with                            mild resistance at 13 mm.                           The exam of the esophagus was otherwise normal.  A few localized, small non-bleeding erosions were                            found in the gastric body and in the gastric                            antrum. There were no stigmata of recent bleeding.                            Biopsies were taken with a cold forceps for                            histology.                           A small hiatal hernia was present.                           The exam of the stomach was otherwise normal.                           The duodenal bulb and second portion of the                            duodenum were normal. Complications:            No immediate complications. Estimated Blood Loss:     Estimated blood loss was minimal. Impression:               - Esophageal mucosal changes were present. Findings                            are suggestive of inflammation. Biopsied.                           - Benign-appearing esophageal stenosis. Dilated.                           - Non-bleeding erosive gastropathy. Biopsied.                           - Small hiatal hernia.                            - Normal duodenal bulb and second portion of the                            duodenum. Recommendation:           - Patient has a contact number available for                            emergencies. The signs and symptoms of potential                            delayed complications were discussed with the  patient. Return to normal activities tomorrow.                            Written discharge instructions were provided to the                            patient.                           - Clear liquid diet today, then advance as                            tolerated to soft diet today.                           - Continue present medications.                           - Await pathology results.                           - Return to my office in 6 weeks. Ladene Artist, MD 01/10/2017 10:08:17 AM This report has been signed electronically.

## 2017-01-10 NOTE — Patient Instructions (Signed)
YOU HAD AN ENDOSCOPIC PROCEDURE TODAY AT [facility]:   Refer to the procedure report that was given to you for any specific questions about what was found during the examination.  If the procedure report does not answer your questions, please call your gastroenterologist to clarify.  If you requested that your care partner not be given the details of your procedure findings, then the procedure report has been included in a sealed envelope for you to review at your convenience later.  YOU SHOULD EXPECT: Some feelings of bloating in the abdomen. Passage of more gas than usual.  Walking can help get rid of the air that was put into your GI tract during the procedure and reduce the bloating. If you had a lower endoscopy (such as a colonoscopy or flexible sigmoidoscopy) you may notice spotting of blood in your stool or on the toilet paper. If you underwent a bowel prep for your procedure, you may not have a normal bowel movement for a few days.  Please Note:  You might notice some irritation and congestion in your nose or some drainage.  This is from the oxygen used during your procedure.  There is no need for concern and it should clear up in a day or so.  SYMPTOMS TO REPORT IMMEDIATELY:   Following upper endoscopy (EGD)  Vomiting of blood or coffee ground material  New chest pain or pain under the shoulder blades  Painful or persistently difficult swallowing  New shortness of breath  Fever of 100F or higher  Black, tarry-looking stools  For urgent or emergent issues, a gastroenterologist can be reached at any hour by calling [phone].   DIET:  Follow  Post Dilatation Diet as ordered.  Drink plenty of fluids but you should avoid alcoholic beverages for 24 hours.  ACTIVITY:  You should plan to take it easy for the rest of today and you should NOT DRIVE or use heavy machinery until tomorrow (because of the sedation medicines used during the test).    FOLLOW UP: Our staff  will call the number listed on your records the next business day following your procedure to check on you and address any questions or concerns that you may have regarding the information given to you following your procedure. If we do not reach you, we will leave a message.  However, if you are feeling well and you are not experiencing any problems, there is no need to return our call.  We will assume that you have returned to your regular daily activities without incident.  If any biopsies were taken you will be contacted by phone or by letter within the next 1-3 weeks.  Please call us at [phone] if you have not heard about the biopsies in 3 weeks.  Post Esophageal Dilation Diet (handout given) Hiatal Hernia (handout given) Esophagitis and Stricture (handout given) Gastritis (handout given)   SIGNATURES/CONFIDENTIALITY: You and/or your care partner have signed paperwork which will be entered into your electronic medical record.  These signatures attest to the fact that that the information above on your After Visit Summary has been reviewed and is understood.  Full responsibility of the confidentiality of this discharge information lies with you and/or your care-partner.

## 2017-01-11 ENCOUNTER — Telehealth: Payer: Self-pay | Admitting: *Deleted

## 2017-01-11 NOTE — Telephone Encounter (Signed)
  Follow up Call-  Call back number 01/10/2017  Post procedure Call Back phone  # [phone]  Permission to leave phone message Yes  Some recent data might be hidden     Patient questions:  Do you have a fever, pain , or abdominal swelling? No. Pain Score  0 *  Have you tolerated food without any problems? Yes.    Have you been able to return to your normal activities? Yes.    Do you have any questions about your discharge instructions: Diet   No. Medications  No. Follow up visit  No.  Do you have questions or concerns about your Care? No.  Actions: * If pain score is 4 or above: No action needed, pain <4.

## 2017-01-19 ENCOUNTER — Other Ambulatory Visit: Payer: Self-pay

## 2017-01-19 MED ORDER — FLUTICASONE PROPIONATE HFA 220 MCG/ACT IN AERO: INHALATION_SPRAY | RESPIRATORY_TRACT | 6 refills | Status: DC

## 2017-01-22 ENCOUNTER — Telehealth: Payer: Self-pay | Admitting: Gastroenterology

## 2017-01-22 NOTE — Telephone Encounter (Signed)
Patient notified that he should take the flovent until follow up.  He had to reschedule his follow up

## 2017-02-14 ENCOUNTER — Ambulatory Visit: Payer: BLUE CROSS/BLUE SHIELD | Admitting: Gastroenterology

## 2017-03-05 ENCOUNTER — Other Ambulatory Visit: Payer: Self-pay | Admitting: Family Medicine

## 2017-03-09 ENCOUNTER — Encounter: Payer: Self-pay | Admitting: Gastroenterology

## 2017-03-09 ENCOUNTER — Ambulatory Visit (INDEPENDENT_AMBULATORY_CARE_PROVIDER_SITE_OTHER): Payer: BLUE CROSS/BLUE SHIELD | Admitting: Gastroenterology

## 2017-03-09 VITALS — BP 120/86 | HR 80 | Ht 69.5 in | Wt 201.2 lb

## 2017-03-09 DIAGNOSIS — K200 Eosinophilic esophagitis: Secondary | ICD-10-CM | POA: Diagnosis not present

## 2017-03-09 MED ORDER — FLUTICASONE PROPIONATE HFA 220 MCG/ACT IN AERO: INHALATION_SPRAY | RESPIRATORY_TRACT | 6 refills | Status: DC

## 2017-03-09 NOTE — Progress Notes (Signed)
    History of Present Illness: This is a 41 year old male with eosinophilic esophagitis returning for follow-up. He has been maintained on Flovent and relates only one episode of dysphagia when he began eating a sandwich very rapidly. Otherwise no complaints.   - Esophageal mucosal changes were present. Findings are suggestive of inflammation. Biopsied. - Benign-appearing esophageal stenosis. Dilated. - Non-bleeding erosive gastropathy. Biopsied. - Small hiatal hernia. - Normal duodenal bulb and second portion of the duodenum.  Diagnosis 1. Surgical [P], esophageal, BX - INCREASED INTRAEPITHELIAL EOSINOPHILS (>25 PER HPF). - NO INTESTINAL METAPLASIA, DYSPLASIA OR MALIGNANCY. 2. Surgical [P], gastric antrum and gastric body - MILD CHRONIC GASTRITIS. - NEGATIVE FOR HELICOBACTER PYLORI. - NO INTESTINAL METAPLASIA, DYSPLASIA, OR MALIGNANCY.  Current Medications, Allergies, Past Medical History, Past Surgical History, Family History and Social History were reviewed in [employer] record.  Physical Exam: General: Well developed, well nourished, no acute distress Head: Normocephalic and atraumatic Eyes:  sclerae anicteric, EOMI Ears: Normal auditory acuity Mouth: No deformity or lesions Lungs: Clear throughout to auscultation Heart: Regular rate and rhythm; no murmurs, rubs or bruits Abdomen: Soft, non tender and non distended. No masses, hepatosplenomegaly or hernias noted. Normal Bowel sounds Musculoskeletal: Symmetrical with no gross deformities  Pulses:  Normal pulses noted Extremities: No clubbing, cyanosis, edema or deformities noted Neurological: Alert oriented x 4, grossly nonfocal Psychological:  Alert and cooperative. Normal mood and affect  Assessment and Recommendations:  1. Eosinophilic esophagitis with esophageal stricture. Chronic gastritis. Continue Flovent twice a day swallowed for 3 months and then discontinue and reassess symptoms. Continue  omeprazole 20 mg daily. Follow antireflux measures. REV in 6 months.

## 2017-03-09 NOTE — Patient Instructions (Signed)
We have sent the following medications to your pharmacy for you to pick up at your convenience: Flovent. Take Flovent for 3 more months then discontinue.  Follow up in 6 months.   Thank you for choosing me and [facility].  Pricilla Riffle. Dagoberto Ligas., MD., [facility]

## 2017-03-19 ENCOUNTER — Other Ambulatory Visit: Payer: Self-pay | Admitting: Family Medicine

## 2017-06-07 DIAGNOSIS — J3081 Allergic rhinitis due to animal (cat) (dog) hair and dander: Secondary | ICD-10-CM | POA: Diagnosis not present

## 2017-06-07 DIAGNOSIS — J301 Allergic rhinitis due to pollen: Secondary | ICD-10-CM | POA: Diagnosis not present

## 2017-06-07 DIAGNOSIS — J3089 Other allergic rhinitis: Secondary | ICD-10-CM | POA: Diagnosis not present

## 2017-06-07 DIAGNOSIS — H1045 Other chronic allergic conjunctivitis: Secondary | ICD-10-CM | POA: Diagnosis not present

## 2017-09-15 ENCOUNTER — Other Ambulatory Visit: Payer: Self-pay | Admitting: Family Medicine

## 2017-09-30 ENCOUNTER — Other Ambulatory Visit: Payer: Self-pay | Admitting: Family Medicine

## 2017-10-16 ENCOUNTER — Other Ambulatory Visit: Payer: Self-pay | Admitting: Family Medicine

## 2017-10-16 NOTE — Telephone Encounter (Signed)
Refill OK

## 2018-02-11 ENCOUNTER — Encounter: Payer: Self-pay | Admitting: Family Medicine

## 2018-02-11 ENCOUNTER — Ambulatory Visit: Payer: BLUE CROSS/BLUE SHIELD | Admitting: Family Medicine

## 2018-02-11 ENCOUNTER — Ambulatory Visit: Payer: Self-pay

## 2018-02-11 VITALS — BP 102/64 | HR 74 | Temp 98.3°F | Wt 200.5 lb

## 2018-02-11 DIAGNOSIS — R21 Rash and other nonspecific skin eruption: Secondary | ICD-10-CM | POA: Diagnosis not present

## 2018-02-11 DIAGNOSIS — T7840XA Allergy, unspecified, initial encounter: Secondary | ICD-10-CM

## 2018-02-11 MED ORDER — METHYLPREDNISOLONE ACETATE 80 MG/ML IJ SUSP
80.0000 mg | INTRAMUSCULAR | Status: AC
  Administered 2018-02-11: 80 mg via INTRAMUSCULAR

## 2018-02-11 MED ORDER — FINASTERIDE 5 MG PO TABS
ORAL_TABLET | ORAL | 1 refills | Status: DC
Start: 2018-02-11 — End: 2018-08-05

## 2018-02-11 NOTE — Telephone Encounter (Signed)
noted 

## 2018-02-11 NOTE — Patient Instructions (Signed)

## 2018-02-11 NOTE — Telephone Encounter (Signed)
Pt. reports used sunscreen on his face last week and began noticing redness to face. Became worse over the weekend. Red "like a sunburn." Puffiness around his eyes. No vision problems.Appointment made for today.  Reason for Disposition . Localized rash present > 7 days  Answer Assessment - Initial Assessment Questions 1. APPEARANCE of RASH: "Describe the rash."      Red 2. LOCATION: "Where is the rash located?"      Face  3. NUMBER: "How many spots are there?"      Just redness "like a sunburn." 4. SIZE: "How big are the spots?" (Inches, centimeters or compare to size of a coin)      n/a 5. ONSET: "When did the rash start?"      Saturday 6. ITCHING: "Does the rash itch?" If so, ask: "How bad is the itch?"  (Scale 1-10; or mild, moderate, severe)     No 7. PAIN: "Does the rash hurt?" If so, ask: "How bad is the pain?"  (Scale 1-10; or mild, moderate, severe)     No 8. OTHER SYMPTOMS: "Do you have any other symptoms?" (e.g., fever)     Swelling/puffiness around the eyes 9. PREGNANCY: "Is there any chance you are pregnant?" "When was your last menstrual period?"     N/A  Protocols used: RASH OR REDNESS - LOCALIZED-A-AH

## 2018-02-11 NOTE — Progress Notes (Signed)
  Subjective:     Patient ID: Samuel Ross, male   DOB: 1975-10-12, 42 y.o.   MRN: [id number]  HPI Patient seen with bilateral facial rash. Onset on Saturday. He had been using some various topical sunblocks and wonders if this might have triggered his rash.  He has some mild pruritis and mild irritation.  No relief with moisturizers and no relief with over-the-counter hydrocortisone cream. No other body rash No fever. No arthralgias.  Past Medical History:  Diagnosis Date  . Allergy   . Chicken pox   . Colon polyps   . Eosinophilic esophagitis   . GERD (gastroesophageal reflux disease)    Past Surgical History:  Procedure Laterality Date  . none      reports that he has quit smoking. He has never used smokeless tobacco. He reports that he drinks alcohol. He reports that he does not use drugs. family history includes Arthritis in his mother; Atrial fibrillation in his father; Diabetes in his maternal grandfather; Diverticulitis in his mother; Heart disease in his father, paternal grandfather, and paternal grandmother; Hyperlipidemia in his maternal grandfather; Prostate cancer in his paternal grandfather; Stroke in his father. No Known Allergies   Review of Systems  Constitutional: Negative for chills and fever.  Skin: Positive for rash.       Objective:   Physical Exam  Constitutional: He appears well-developed and well-nourished.  Cardiovascular: Normal rate and regular rhythm.  Pulmonary/Chest: Effort normal and breath sounds normal.  Skin: Rash noted.  Slightly raised erythematous rash on cheek region bilaterally       Assessment:     Facial rash. Suspect allergic rash from recent topicals    Plan:     -Leave off all topical medications other than over-the-counter hydrocortisone cream -Depo-Medrol 80 mg IM given -Try to avoid direct sunlight as much as possible next few days -Touch base if rash not improving in 2 days  Eulas Post MD [facility] at  [facility]

## 2018-03-05 ENCOUNTER — Other Ambulatory Visit: Payer: Self-pay | Admitting: Sports Medicine

## 2018-03-05 DIAGNOSIS — M25532 Pain in left wrist: Secondary | ICD-10-CM

## 2018-03-19 ENCOUNTER — Ambulatory Visit
Admission: RE | Admit: 2018-03-19 | Discharge: 2018-03-19 | Disposition: A | Discharge status: Home / Self Care | Payer: BLUE CROSS/BLUE SHIELD | Source: Ambulatory Visit | Attending: Sports Medicine | Admitting: Sports Medicine

## 2018-03-19 DIAGNOSIS — M67432 Ganglion, left wrist: Secondary | ICD-10-CM | POA: Diagnosis not present

## 2018-03-19 DIAGNOSIS — M25532 Pain in left wrist: Secondary | ICD-10-CM

## 2018-03-19 MED ORDER — METHYLPREDNISOLONE ACETATE 40 MG/ML INJ SUSP (RADIOLOG
80.0000 mg | Freq: Once | INTRAMUSCULAR | Status: AC
  Administered 2018-03-19: 80 mg via INTRA_ARTICULAR

## 2018-03-19 MED ORDER — IOPAMIDOL (ISOVUE-M 200) INJECTION 41%
2.0000 mL | Freq: Once | INTRAMUSCULAR | Status: AC
  Administered 2018-03-19: 2 mL via INTRA_ARTICULAR

## 2018-04-09 ENCOUNTER — Encounter: Payer: Self-pay | Admitting: Family Medicine

## 2018-04-09 ENCOUNTER — Ambulatory Visit: Payer: BLUE CROSS/BLUE SHIELD | Admitting: Family Medicine

## 2018-04-09 VITALS — BP 120/80 | HR 73 | Temp 98.3°F | Wt 199.1 lb

## 2018-04-09 DIAGNOSIS — J0110 Acute frontal sinusitis, unspecified: Secondary | ICD-10-CM | POA: Diagnosis not present

## 2018-04-09 MED ORDER — AMOXICILLIN-POT CLAVULANATE 875-125 MG PO TABS: 1.0000 | ORAL_TABLET | Freq: Two times a day (BID) | ORAL | 0 refills | Status: DC

## 2018-04-09 NOTE — Progress Notes (Signed)
  Subjective:     Patient ID: Samuel Ross, male   DOB: 14-Jul-1976, 42 y.o.   MRN: [id number]  HPI Patient seen with 2 week history of cough, right frontal sinus pressure, thick yellow nasal discharge, fatigue. No fever. Initially started as typical cold-like symptoms but symptoms have progressed over the past several days. No relief with over-the-counter medications  Past Medical History:  Diagnosis Date  . Allergy   . Chicken pox   . Colon polyps   . Eosinophilic esophagitis   . GERD (gastroesophageal reflux disease)    Past Surgical History:  Procedure Laterality Date  . none      reports that he has quit smoking. He has never used smokeless tobacco. He reports that he drinks alcohol. He reports that he does not use drugs. family history includes Arthritis in his mother; Atrial fibrillation in his father; Diabetes in his maternal grandfather; Diverticulitis in his mother; Heart disease in his father, paternal grandfather, and paternal grandmother; Hyperlipidemia in his maternal grandfather; Prostate cancer in his paternal grandfather; Stroke in his father. No Known Allergies   Review of Systems  Constitutional: Negative for chills and fever.  HENT: Positive for congestion, sinus pressure and sinus pain. Negative for ear pain.   Respiratory: Positive for cough.   Neurological: Positive for headaches.       Objective:   Physical Exam  Constitutional: He appears well-developed and well-nourished.  HENT:  Right Ear: External ear normal.  Left Ear: External ear normal.  Mouth/Throat: Oropharynx is clear and moist.  Neck: Neck supple.  Cardiovascular: Normal rate and regular rhythm.  Pulmonary/Chest: Effort normal and breath sounds normal.       Assessment:     Probable acute right frontal sinusitis following viral URI    Plan:     -Augmentin 875 mg twice daily with food for 10 days -Stay well-hydrated -Follow-up for any persistent or worsening symptoms  Eulas Post MD [facility]

## 2018-04-09 NOTE — Patient Instructions (Signed)

## 2018-05-02 DIAGNOSIS — M25532 Pain in left wrist: Secondary | ICD-10-CM | POA: Diagnosis not present

## 2018-05-02 DIAGNOSIS — M24139 Other articular cartilage disorders, unspecified wrist: Secondary | ICD-10-CM | POA: Diagnosis not present

## 2018-06-20 DIAGNOSIS — H1045 Other chronic allergic conjunctivitis: Secondary | ICD-10-CM | POA: Diagnosis not present

## 2018-06-20 DIAGNOSIS — J301 Allergic rhinitis due to pollen: Secondary | ICD-10-CM | POA: Diagnosis not present

## 2018-06-20 DIAGNOSIS — J3081 Allergic rhinitis due to animal (cat) (dog) hair and dander: Secondary | ICD-10-CM | POA: Diagnosis not present

## 2018-06-20 DIAGNOSIS — J3089 Other allergic rhinitis: Secondary | ICD-10-CM | POA: Diagnosis not present

## 2018-08-05 ENCOUNTER — Other Ambulatory Visit: Payer: Self-pay | Admitting: Family Medicine

## 2018-08-05 NOTE — Telephone Encounter (Signed)
He only takes 1/3 tablet daily.  May refill for #11 but give 5 refills.

## 2018-08-05 NOTE — Telephone Encounter (Signed)
Last OV 04/09/18, No future OV  Last filled 02/11/18, # 30 with 1 refill  Is patient still taking this medication? (Rx asking for 11 tablets with 0 refills?)

## 2018-09-11 ENCOUNTER — Encounter: Payer: Self-pay | Admitting: Family Medicine

## 2018-09-11 ENCOUNTER — Other Ambulatory Visit: Payer: Self-pay

## 2018-09-11 ENCOUNTER — Ambulatory Visit (INDEPENDENT_AMBULATORY_CARE_PROVIDER_SITE_OTHER): Payer: BLUE CROSS/BLUE SHIELD | Admitting: Family Medicine

## 2018-09-11 VITALS — BP 124/74 | HR 83 | Temp 98.4°F | Wt 202.2 lb

## 2018-09-11 DIAGNOSIS — J019 Acute sinusitis, unspecified: Secondary | ICD-10-CM

## 2018-09-11 MED ORDER — FLUTICASONE PROPIONATE HFA 220 MCG/ACT IN AERO: INHALATION_SPRAY | RESPIRATORY_TRACT | 6 refills | Status: DC

## 2018-09-11 MED ORDER — AMOXICILLIN-POT CLAVULANATE 875-125 MG PO TABS: 1.0000 | ORAL_TABLET | Freq: Two times a day (BID) | ORAL | 0 refills | Status: DC

## 2018-09-11 NOTE — Patient Instructions (Signed)
Sinusitis, Adult  Sinusitis is inflammation of your sinuses. Sinuses are hollow spaces in the bones around your face. Your sinuses are located:   Around your eyes.   In the middle of your forehead.   Behind your nose.   In your cheekbones.  Mucus normally drains out of your sinuses. When your nasal tissues become inflamed or swollen, mucus can become trapped or blocked. This allows bacteria, viruses, and fungi to grow, which leads to infection. Most infections of the sinuses are caused by a virus.  Sinusitis can develop quickly. It can last for up to 4 weeks (acute) or for more than 12 weeks (chronic). Sinusitis often develops after a cold.  What are the causes?  This condition is caused by anything that creates swelling in the sinuses or stops mucus from draining. This includes:   Allergies.   Asthma.   Infection from bacteria or viruses.   Deformities or blockages in your nose or sinuses.   Abnormal growths in the nose (nasal polyps).   Pollutants, such as chemicals or irritants in the air.   Infection from fungi (rare).  What increases the risk?  You are more likely to develop this condition if you:   Have a weak body defense system (immune system).   Do a lot of swimming or diving.   Overuse nasal sprays.   Smoke.  What are the signs or symptoms?  The main symptoms of this condition are pain and a feeling of pressure around the affected sinuses. Other symptoms include:   Stuffy nose or congestion.   Thick drainage from your nose.   Swelling and warmth over the affected sinuses.   Headache.   Upper toothache.   A cough that may get worse at night.   Extra mucus that collects in the throat or the back of the nose (postnasal drip).   Decreased sense of smell and taste.   Fatigue.   A fever.   Sore throat.   Bad breath.  How is this diagnosed?  This condition is diagnosed based on:   Your symptoms.   Your medical history.   A physical exam.   Tests to find out if your condition is  acute or chronic. This may include:  ? Checking your nose for nasal polyps.  ? Viewing your sinuses using a device that has a light (endoscope).  ? Testing for allergies or bacteria.  ? Imaging tests, such as an MRI or CT scan.  In rare cases, a bone biopsy may be done to rule out more serious types of fungal sinus disease.  How is this treated?  Treatment for sinusitis depends on the cause and whether your condition is chronic or acute.   If caused by a virus, your symptoms should go away on their own within 10 days. You may be given medicines to relieve symptoms. They include:  ? Medicines that shrink swollen nasal passages (topical intranasal decongestants).  ? Medicines that treat allergies (antihistamines).  ? A spray that eases inflammation of the nostrils (topical intranasal corticosteroids).  ? Rinses that help get rid of thick mucus in your nose (nasal saline washes).   If caused by bacteria, your health care provider may recommend waiting to see if your symptoms improve. Most bacterial infections will get better without antibiotic medicine. You may be given antibiotics if you have:  ? A severe infection.  ? A weak immune system.   If caused by narrow nasal passages or nasal polyps, you may need   to have surgery.  Follow these instructions at home:  Medicines   Take, use, or apply over-the-counter and prescription medicines only as told by your health care provider. These may include nasal sprays.   If you were prescribed an antibiotic medicine, take it as told by your health care provider. Do not stop taking the antibiotic even if you start to feel better.  Hydrate and humidify     Drink enough fluid to keep your urine pale yellow. Staying hydrated will help to thin your mucus.   Use a cool mist humidifier to keep the humidity level in your home above 50%.   Inhale steam for 10-15 minutes, 3-4 times a day, or as told by your health care provider. You can do this in the bathroom while a hot shower is  running.   Limit your exposure to cool or dry air.  Rest   Rest as much as possible.   Sleep with your head raised (elevated).   Make sure you get enough sleep each night.  General instructions     Apply a warm, moist washcloth to your face 3-4 times a day or as told by your health care provider. This will help with discomfort.   Wash your hands often with soap and water to reduce your exposure to germs. If soap and water are not available, use hand sanitizer.   Do not smoke. Avoid being around people who are smoking (secondhand smoke).   Keep all follow-up visits as told by your health care provider. This is important.  Contact a health care provider if:   You have a fever.   Your symptoms get worse.   Your symptoms do not improve within 10 days.  Get help right away if:   You have a severe headache.   You have persistent vomiting.   You have severe pain or swelling around your face or eyes.   You have vision problems.   You develop confusion.   Your neck is stiff.   You have trouble breathing.  Summary   Sinusitis is soreness and inflammation of your sinuses. Sinuses are hollow spaces in the bones around your face.   This condition is caused by nasal tissues that become inflamed or swollen. The swelling traps or blocks the flow of mucus. This allows bacteria, viruses, and fungi to grow, which leads to infection.   If you were prescribed an antibiotic medicine, take it as told by your health care provider. Do not stop taking the antibiotic even if you start to feel better.   Keep all follow-up visits as told by your health care provider. This is important.  This information is not intended to replace advice given to you by your health care provider. Make sure you discuss any questions you have with your health care provider.  Document Released: 08/07/2005 Document Revised: 01/07/2018 Document Reviewed: 01/07/2018  [employer] Interactive Patient Education  2019 [employer]

## 2018-09-11 NOTE — Progress Notes (Signed)
  Subjective:     Patient ID: Samuel Ross, male   DOB: July 31, 1976, 43 y.o.   MRN: [id number]  HPI Patient is seen with 3-week history of sinus congestion symptoms.  At onset he thought this was allergies or possibly cold.  Initially had clear drainage.  Now has some thick greenish drainage.  Frequent sore throat.  No significant cough.  No fever.  Denies any facial pain or upper teeth pain.  Past Medical History:  Diagnosis Date  . Allergy   . Chicken pox   . Colon polyps   . Eosinophilic esophagitis   . GERD (gastroesophageal reflux disease)    Past Surgical History:  Procedure Laterality Date  . none      reports that he has quit smoking. He has never used smokeless tobacco. He reports current alcohol use. He reports that he does not use drugs. family history includes Arthritis in his mother; Atrial fibrillation in his father; Diabetes in his maternal grandfather; Diverticulitis in his mother; Heart disease in his father, paternal grandfather, and paternal grandmother; Hyperlipidemia in his maternal grandfather; Prostate cancer in his paternal grandfather; Stroke in his father. No Known Allergies   Review of Systems  Constitutional: Negative for chills and fever.  HENT: Positive for congestion, sinus pressure and sore throat.   Respiratory: Negative for cough.        Objective:   Physical Exam Constitutional:      Appearance: Normal appearance.  HENT:     Right Ear: Tympanic membrane normal.     Left Ear: Tympanic membrane normal.     Nose:     Comments: Only mild mucosal erythema    Mouth/Throat:     Pharynx: No oropharyngeal exudate or posterior oropharyngeal erythema.  Neck:     Musculoskeletal: Neck supple.  Cardiovascular:     Rate and Rhythm: Normal rate and regular rhythm.  Pulmonary:     Effort: Pulmonary effort is normal.     Breath sounds: Normal breath sounds. No wheezing or rales.  Lymphadenopathy:     Cervical: No cervical adenopathy.  Neurological:    Mental Status: He is alert.        Assessment:     3-week history of persistent sinusitis symptoms.    Plan:     -Given duration of symptoms we decided to go and cover with Augmentin 875 mg twice daily take with food -Follow-up for any persistent or worsening symptoms  Eulas Post MD [facility] at [facility]

## 2019-03-18 ENCOUNTER — Other Ambulatory Visit: Payer: Self-pay | Admitting: Family Medicine

## 2019-04-18 ENCOUNTER — Other Ambulatory Visit: Payer: Self-pay | Admitting: Family Medicine

## 2019-04-18 NOTE — Telephone Encounter (Signed)
This patient has not had labs since 2007. He last saw you on 08/2018 for acute sinusitis and I sent a message for him to come in for visit with labs for further refills.  Please advise. He needs an appointment for continued use of this medication.

## 2019-04-20 NOTE — Telephone Encounter (Signed)
Refill for one year.  He does not have to have labs for this very low dose.  We can offer CPE.

## 2019-05-21 ENCOUNTER — Other Ambulatory Visit: Payer: Self-pay | Admitting: Family Medicine

## 2019-05-24 DIAGNOSIS — Z23 Encounter for immunization: Secondary | ICD-10-CM | POA: Diagnosis not present

## 2019-06-25 ENCOUNTER — Other Ambulatory Visit: Payer: Self-pay | Admitting: Family Medicine

## 2019-07-02 DIAGNOSIS — H1045 Other chronic allergic conjunctivitis: Secondary | ICD-10-CM | POA: Diagnosis not present

## 2019-07-02 DIAGNOSIS — J3089 Other allergic rhinitis: Secondary | ICD-10-CM | POA: Diagnosis not present

## 2019-07-02 DIAGNOSIS — J301 Allergic rhinitis due to pollen: Secondary | ICD-10-CM | POA: Diagnosis not present

## 2019-07-02 DIAGNOSIS — J3081 Allergic rhinitis due to animal (cat) (dog) hair and dander: Secondary | ICD-10-CM | POA: Diagnosis not present

## 2019-07-28 ENCOUNTER — Other Ambulatory Visit: Payer: Self-pay | Admitting: Family Medicine

## 2019-08-24 ENCOUNTER — Other Ambulatory Visit: Payer: Self-pay | Admitting: Family Medicine

## 2019-09-15 ENCOUNTER — Other Ambulatory Visit: Payer: Self-pay

## 2019-09-16 ENCOUNTER — Ambulatory Visit: Payer: BC Managed Care – PPO | Admitting: Family Medicine

## 2019-09-16 ENCOUNTER — Encounter: Payer: Self-pay | Admitting: Family Medicine

## 2019-09-16 ENCOUNTER — Other Ambulatory Visit: Payer: Self-pay

## 2019-09-16 DIAGNOSIS — L659 Nonscarring hair loss, unspecified: Secondary | ICD-10-CM | POA: Diagnosis not present

## 2019-09-16 MED ORDER — FINASTERIDE 5 MG PO TABS
ORAL_TABLET | ORAL | 3 refills | Status: DC
Start: 2019-09-16 — End: 2020-07-14

## 2019-09-16 NOTE — Progress Notes (Signed)
Established Patient Office Visit  Subjective:  Patient ID: Samuel Ross, male    DOB: 02-24-1976  Age: 44 y.o. MRN: [id number]  CC:  Chief Complaint  Patient presents with  . Medication Refill    Doing well    HPI Samuel Ross presents for follow-up visit for refill of medications, specifically to refill his finasteride He denies complaints or concerns and states his present state of health is good Denies mental health concerns; states he is enjoying activities with his family and remodeling a kitchen at home Works for [employer] and presently works from home due to [employer] restrictions in his workplace States he has seen satisfactory results of decreased hair loss with finasteride No hx of BPH and denies urinary frequency, urgency, retention, or other concerns Denies symptoms of thyroid disease in past or present  Past Medical History:  Diagnosis Date  . Allergy   . Chicken pox   . Colon polyps   . Eosinophilic esophagitis   . GERD (gastroesophageal reflux disease)     Past Surgical History:  Procedure Laterality Date  . none      Family History  Problem Relation Age of Onset  . Arthritis Mother   . Diverticulitis Mother   . Heart disease Father   . Stroke Father   . Atrial fibrillation Father   . Hyperlipidemia Maternal Grandfather   . Diabetes Maternal Grandfather   . Heart disease Paternal Grandmother   . Heart disease Paternal Grandfather   . Prostate cancer Paternal Grandfather   . Colon cancer Neg Hx   . Stomach cancer Neg Hx   . Pancreatic cancer Neg Hx   . Esophageal cancer Neg Hx     Social History   Socioeconomic History  . Marital status: Married    Spouse name: Not on file  . Number of children: Not on file  . Years of education: Not on file  . Highest education level: Not on file  Occupational History  . Not on file  Tobacco Use  . 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 Smokeless tobacco: Never Used  Substance and Sexual Activity  . Alcohol use:  Yes    Comment: 1 beverage daily  . Drug use: No  . Sexual activity: Not on file  Other Topics Concern  . Not on file  Social History Narrative  . Not on file   Social Determinants of Health   Financial Resource Strain:   . Difficulty of Paying Living Expenses: Not on file  Food Insecurity:   . Worried About Charity fundraiser in the Last Year: Not on file  . Ran Out of Food in the Last Year: Not on file  Transportation Needs:   . Lack of Transportation (Medical): Not on file  . Lack of Transportation (Non-Medical): Not on file  Physical Activity:   . Days of Exercise per Week: Not on file  . Minutes of Exercise per Session: Not on file  Stress:   . Feeling of Stress : Not on file  Social Connections:   . Frequency of Communication with Friends and Family: Not on file  . Frequency of Social Gatherings with Friends and Family: Not on file  . Attends Religious Services: Not on file  . Active Member of Clubs or Organizations: Not on file  . Attends Archivist Meetings: Not on file  . Marital Status: Not on file  Intimate Partner Violence:   . Fear of Current or Ex-Partner: Not on file  .  Emotionally Abused: Not on file  . Physically Abused: Not on file  . Sexually Abused: Not on file    Outpatient Medications Prior to Visit  Medication Sig Dispense Refill  . finasteride (PROSCAR) 5 MG tablet TAKE 1/3 TABLET BY MOUTH ONCE A DAY 9 tablet 0  . fluticasone (FLOVENT HFA) 220 MCG/ACT inhaler Swallow 2 puffs twice a day.  DO NOT INHALE INTO LUNG.  Rinse mouth after swallows.  Do not eat or drink for 30 minutes prior to or after swallowing med. 1 Inhaler 6  . levocetirizine (XYZAL) 5 MG tablet Take 5 mg by mouth as needed.     . mometasone (NASONEX) 50 MCG/ACT nasal spray Place 2 sprays into the nose as needed.     . montelukast (SINGULAIR) 10 MG tablet Take 10 mg by mouth as needed.     Marland Kitchen amoxicillin-clavulanate (AUGMENTIN) 875-125 MG tablet Take 1 tablet by mouth 2  (two) times daily. (Patient not taking: Reported on 09/16/2019) 20 tablet 0  . omeprazole (PRILOSEC) 20 MG capsule Take 20 mg by mouth daily.     Facility-Administered Medications Prior to Visit  Medication Dose Route Frequency Provider Last Rate Last Admin  . 0.9 %  sodium chloride infusion  500 mL Intravenous Continuous Ladene Artist, MD        No Known Allergies  ROS Review of Systems  GEN: Well nourished, well developed, in no acute distress HEENT: normocephalic, atraumatic; hair distribution reveals pattern of diffuse alopecia Neck: no JVD, no masses Cardiac: RRR; no murmurs, rubs, or gallops, no edema  Respiratory:  clear to auscultation bilaterally, normal work of breathing GI:  Abdomen non-distended MS: no deformity or atrophy Skin: warm and dry, no rash Neuro:  Alert and Oriented x 3, Strength and sensation are intact, follows commands Psych: euthymic mood, full affect    Objective:    Physical Exam  BP 122/84   Pulse 75   Temp 97.8 F (36.6 C) (Temporal)   Ht '5\' 10"'  (1.778 m)   Wt 195 lb (88.5 kg)   SpO2 99%   BMI 27.98 kg/m  Wt Readings from Last 3 Encounters:  09/16/19 195 lb (88.5 kg)  09/11/18 202 lb 3.2 oz (91.7 kg)  04/09/18 199 lb 1.6 oz (90.3 kg)     Health Maintenance Due  Topic Date Due  . HIV Screening  12/27/1988  . TETANUS/TDAP  12/27/1992  . INFLUENZA VACCINE  03/22/2019    There are no preventive care reminders to display for this patient.  No results found for: TSH No results found for: WBC, HGB, HCT, MCV, PLT No results found for: NA, K, CHLORIDE, CO2, GLUCOSE, BUN, CREATININE, BILITOT, ALKPHOS, AST, ALT, PROT, ALBUMIN, CALCIUM, ANIONGAP, EGFR, GFR No results found for: CHOL No results found for: HDL No results found for: LDLCALC No results found for: TRIG No results found for: CHOLHDL No results found for: HGBA1C    Assessment & Plan:   Alopecia- diffuse Plan: Finasteride refilled per patient requests for 90 day  supply Patient advised to return in one year or before as needed  No orders of the defined types were placed in this encounter.   Follow-up: No follow-ups on file.    Samuel Life, RN   Agree with note above.  Eulas Post MD [facility] at [facility]

## 2020-05-23 IMAGING — XA DG FLUORO GUIDE NDL PLC/BX
3 series · 3 of 3 positions shown · non-contrast
Comparison: none

CLINICAL DATA: LEFT wrist pain.

[Series 1: ortho standard · 1 of 1 slices shown (1 of 3)]
[im 1/1]
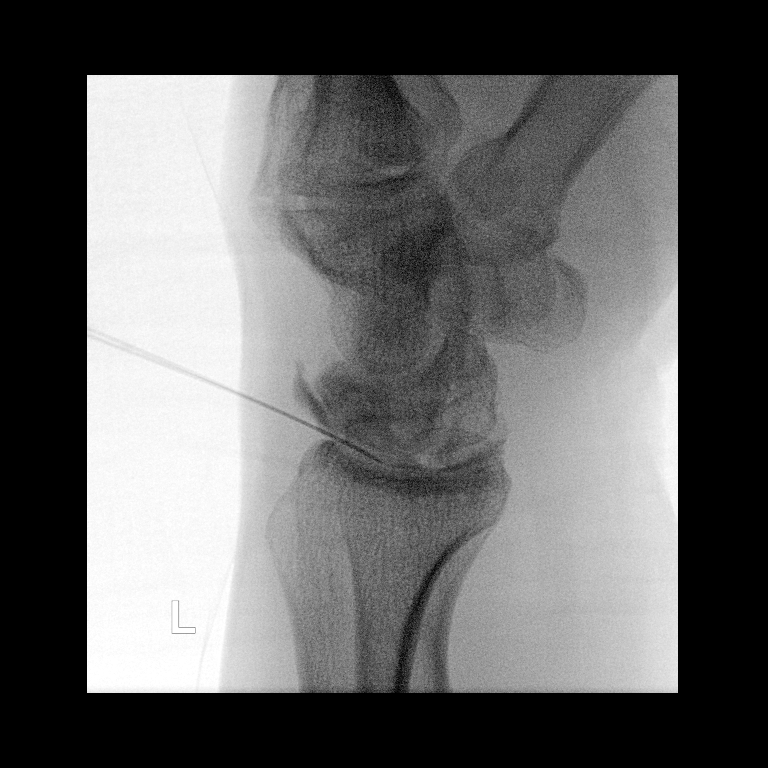

[Series 2: ortho standard · 1 of 1 slices shown (2 of 3)]
[im 1/1]
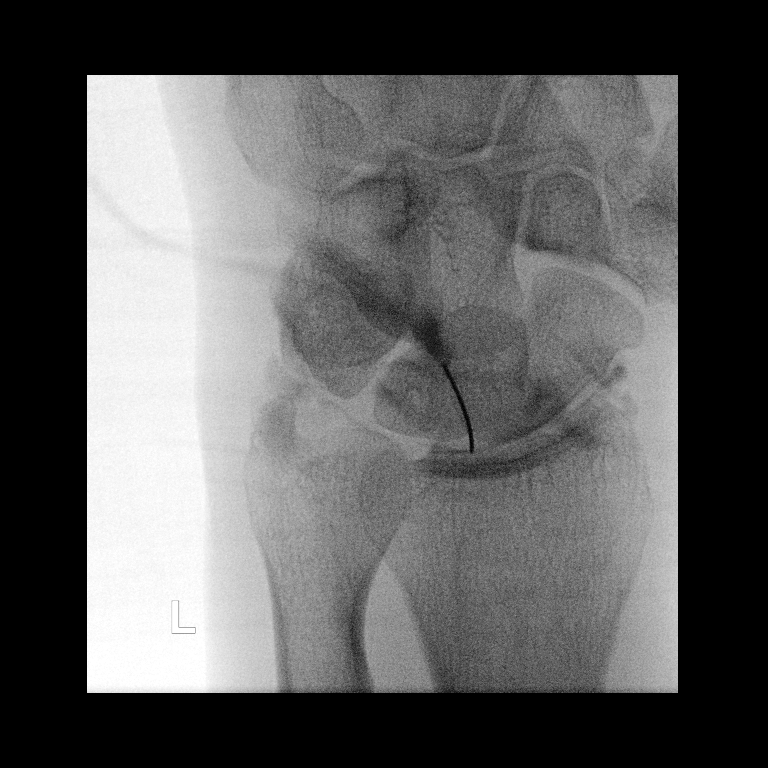

[Series 3: ortho standard · 1 of 1 slices shown (3 of 3)]
[im 1/1]
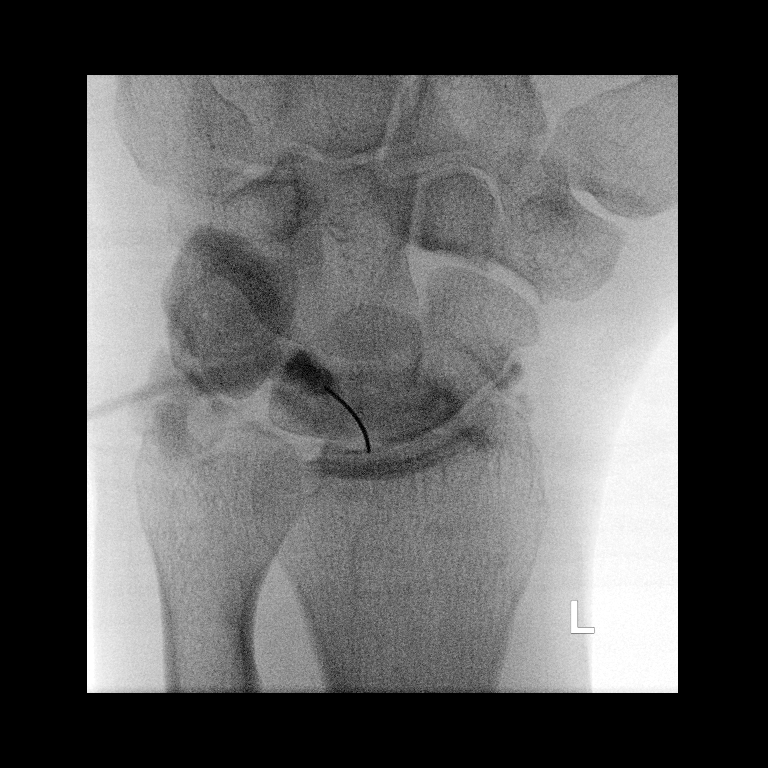

[3 of 3 positions shown; findings below may reference images not displayed]

FLUOROSCOPY TIME:  12 seconds corresponding to a Dose Area Product
of 0.79 Gy*m2

PROCEDURE:
LEFT WRIST INJECTION FOR MRI

After a thorough discussion of risks and benefits of the procedure,
written and oral informed consent was obtained. The consent
discussion included the risk of bleeding, infection and injury to
nerves and adjacent blood vessels. Time out form Completed when
appropriate. We discussed the high likelihood of good diagnostic
study.

Preliminary localization was performed over the LEFT wrist. The area
was marked over dorsal scaphoid.

After prep and drape in the usual sterile fashion, the skin and
deeper subcutaneous tissues were anesthetized with 1% Lidocaine
without Epinephrine. Under fluoroscopic guidance, a 23 gauge
hypodermic needle was advanced into the joint between distal radius
using a dorsal approach. The joint was distended with 1.5 ml of a
[DATE] dilution of Multihance contrast. The MR arthrogram solution
was as follows: 7.5 ml of Isovue M 200 contrast agent, 0.05 mL
Multihance , 2.5 ml of Lidocaine. Additionally, per the request of
the ordering provider, 80 mg of Depo-Medrol was added to the joint.
After opacification of the joint, injection was discontinued, the
needle removed, and a sterile dressing applied. The patient was
taken to MRI for subsequent imaging.

The patient tolerated the procedure well and there were no
complications.
IMPRESSION: Successful LEFT wrist fluoroscopically guided injection for MR
arthrogram.

## 2020-07-08 DIAGNOSIS — H1045 Other chronic allergic conjunctivitis: Secondary | ICD-10-CM | POA: Diagnosis not present

## 2020-07-08 DIAGNOSIS — J301 Allergic rhinitis due to pollen: Secondary | ICD-10-CM | POA: Diagnosis not present

## 2020-07-08 DIAGNOSIS — J3089 Other allergic rhinitis: Secondary | ICD-10-CM | POA: Diagnosis not present

## 2020-07-08 DIAGNOSIS — J3081 Allergic rhinitis due to animal (cat) (dog) hair and dander: Secondary | ICD-10-CM | POA: Diagnosis not present

## 2020-07-14 ENCOUNTER — Other Ambulatory Visit: Payer: Self-pay

## 2020-07-14 ENCOUNTER — Encounter: Payer: Self-pay | Admitting: Family Medicine

## 2020-07-14 ENCOUNTER — Ambulatory Visit: Payer: BC Managed Care – PPO | Admitting: Family Medicine

## 2020-07-14 VITALS — BP 110/70 | HR 70 | Temp 98.7°F | Wt 202.9 lb

## 2020-07-14 DIAGNOSIS — L03011 Cellulitis of right finger: Secondary | ICD-10-CM | POA: Diagnosis not present

## 2020-07-14 MED ORDER — FINASTERIDE 5 MG PO TABS: ORAL_TABLET | ORAL | 3 refills | Status: DC

## 2020-07-14 MED ORDER — CEPHALEXIN 500 MG PO CAPS: 500.0000 mg | ORAL_CAPSULE | Freq: Four times a day (QID) | ORAL | 0 refills | Status: DC

## 2020-07-14 NOTE — Progress Notes (Signed)
Established Patient Office Visit  Subjective:  Patient ID: Samuel Ross, male    DOB: 1976-06-18  Age: 44 y.o. MRN: [id number]  CC:  Chief Complaint  Patient presents with  . thumb pain    right     HPI Siraj Dermody presents for 1 day history of right thumb pain.  He first noticed some swelling a little redness yesterday.  Denies any injury.  No fevers or chills.  No known history of MRSA.  Pain is moderate.  He also noticed some warmth.  Generally very healthy with no underlying medical problems.  No known drug allergies.  Past Medical History:  Diagnosis Date  . Allergy   . Chicken pox   . Colon polyps   . Eosinophilic esophagitis   . GERD (gastroesophageal reflux disease)     Past Surgical History:  Procedure Laterality Date  . none      Family History  Problem Relation Age of Onset  . Arthritis Mother   . Diverticulitis Mother   . Heart disease Father   . Stroke Father   . Atrial fibrillation Father   . Hyperlipidemia Maternal Grandfather   . Diabetes Maternal Grandfather   . Heart disease Paternal Grandmother   . Heart disease Paternal Grandfather   . Prostate cancer Paternal Grandfather   . Colon cancer Neg Hx   . Stomach cancer Neg Hx   . Pancreatic cancer Neg Hx   . Esophageal cancer Neg Hx     Social History   Socioeconomic History  . Marital status: Married    Spouse name: Not on file  . Number of children: Not on file  . Years of education: Not on file  . Highest education level: Not on file  Occupational History  . Not on file  Tobacco Use  . 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 Smokeless tobacco: Never Used  Vaping Use  . Vaping Use: Never used  Substance and Sexual Activity  . Alcohol use: Yes    Comment: 1 beverage daily  . Drug use: No  . Sexual activity: Not on file  Other Topics Concern  . Not on file  Social History Narrative  . Not on file   Social Determinants of Health   Financial Resource Strain:   . Difficulty of Paying  Living Expenses: Not on file  Food Insecurity:   . Worried About Charity fundraiser in the Last Year: Not on file  . Ran Out of Food in the Last Year: Not on file  Transportation Needs:   . Lack of Transportation (Medical): Not on file  . Lack of Transportation (Non-Medical): Not on file  Physical Activity:   . Days of Exercise per Week: Not on file  . Minutes of Exercise per Session: Not on file  Stress:   . Feeling of Stress : Not on file  Social Connections:   . Frequency of Communication with Friends and Family: Not on file  . Frequency of Social Gatherings with Friends and Family: Not on file  . Attends Religious Services: Not on file  . Active Member of Clubs or Organizations: Not on file  . Attends Archivist Meetings: Not on file  . Marital Status: Not on file  Intimate Partner Violence:   . Fear of Current or Ex-Partner: Not on file  . Emotionally Abused: Not on file  . Physically Abused: Not on file  . Sexually Abused: Not on file    Outpatient Medications Prior to Visit  Medication Sig Dispense Refill  . fluticasone (FLOVENT HFA) 220 MCG/ACT inhaler Swallow 2 puffs twice a day.  DO NOT INHALE INTO LUNG.  Rinse mouth after swallows.  Do not eat or drink for 30 minutes prior to or after swallowing med. 1 Inhaler 6  . levocetirizine (XYZAL) 5 MG tablet Take 5 mg by mouth as needed.     . mometasone (NASONEX) 50 MCG/ACT nasal spray Place 2 sprays into the nose as needed.     . montelukast (SINGULAIR) 10 MG tablet Take 10 mg by mouth as needed.     Marland Kitchen omeprazole (PRILOSEC) 20 MG capsule Take 20 mg by mouth daily.    . finasteride (PROSCAR) 5 MG tablet Take one third of tablet once daily. 30 tablet 3   Facility-Administered Medications Prior to Visit  Medication Dose Route Frequency Provider Last Rate Last Admin  . 0.9 %  sodium chloride infusion  500 mL Intravenous Continuous Ladene Artist, MD        No Known Allergies  ROS Review of Systems    Constitutional: Negative for chills and fever.  Gastrointestinal: Negative for nausea and vomiting.      Objective:    Physical Exam Vitals reviewed.  Constitutional:      Appearance: Normal appearance.  Cardiovascular:     Rate and Rhythm: Normal rate and regular rhythm.  Skin:    Comments: Right thumb reveals some mild erythema and swelling basically from the distal interphalangeal joint to near the cuticle region.  There is no visible purulence.  No pustules.  No vesicles.  Mildly warm to touch and moderately tender to palpation.  No fluctuance  Neurological:     Mental Status: He is alert.     BP 110/70 (BP Location: Left Arm, Patient Position: Sitting, Cuff Size: Large)   Pulse 70   Temp 98.7 F (37.1 C) (Oral)   Wt 202 lb 14.4 oz (92 kg)   SpO2 97%   BMI 29.11 kg/m  Wt Readings from Last 3 Encounters:  07/14/20 202 lb 14.4 oz (92 kg)  09/16/19 195 lb (88.5 kg)  09/11/18 202 lb 3.2 oz (91.7 kg)     Health Maintenance Due  Topic Date Due  . Hepatitis C Screening  Never done  . HIV Screening  Never done  . TETANUS/TDAP  Never done    There are no preventive care reminders to display for this patient.  No results found for: TSH No results found for: WBC, HGB, HCT, MCV, PLT No results found for: NA, K, CHLORIDE, CO2, GLUCOSE, BUN, CREATININE, BILITOT, ALKPHOS, AST, ALT, PROT, ALBUMIN, CALCIUM, ANIONGAP, EGFR, GFR No results found for: CHOL No results found for: HDL No results found for: LDLCALC No results found for: TRIG No results found for: CHOLHDL No results found for: HGBA1C    Assessment & Plan:   Cellulitis involving right thumb.  No obvious breaks in skin noted.  No visible purulence.  -Keflex 500 mg 4 times daily for 7 days -Warm water soaks several times daily -Follow-up promptly for any fever, chills, or any progressive redness or swelling  Meds ordered this encounter  Medications  . finasteride (PROSCAR) 5 MG tablet    Sig: Take one  third of tablet once daily.    Dispense:  30 tablet    Refill:  3  . cephALEXin (KEFLEX) 500 MG capsule    Sig: Take 1 capsule (500 mg total) by mouth 4 (four) times daily.    Dispense:  28 capsule    Refill:  0    Follow-up: No follow-ups on file.    Carolann Littler, MD

## 2020-07-14 NOTE — Patient Instructions (Signed)
Do warm water soaks to thumb several times daily  Start the Keflex  Follow up for any fever, chills, progressive redness or swelling.

## 2020-10-04 ENCOUNTER — Other Ambulatory Visit: Payer: Self-pay | Admitting: Family Medicine

## 2021-03-02 ENCOUNTER — Encounter: Payer: BC Managed Care – PPO | Admitting: Family Medicine

## 2021-03-09 ENCOUNTER — Ambulatory Visit (INDEPENDENT_AMBULATORY_CARE_PROVIDER_SITE_OTHER): Payer: BC Managed Care – PPO | Admitting: Family Medicine

## 2021-03-09 ENCOUNTER — Other Ambulatory Visit: Payer: Self-pay

## 2021-03-09 ENCOUNTER — Encounter: Payer: Self-pay | Admitting: Family Medicine

## 2021-03-09 VITALS — BP 110/64 | HR 74 | Temp 97.9°F | Ht 70.0 in | Wt 198.3 lb

## 2021-03-09 DIAGNOSIS — Z0000 Encounter for general adult medical examination without abnormal findings: Secondary | ICD-10-CM | POA: Diagnosis not present

## 2021-03-09 DIAGNOSIS — Z1159 Encounter for screening for other viral diseases: Secondary | ICD-10-CM | POA: Diagnosis not present

## 2021-03-09 LAB — LIPID PANEL
Cholesterol: 223 mg/dL — ABNORMAL HIGH (ref 0–200)
HDL: 89.2 mg/dL (ref >39.00)
LDL Cholesterol: 112 mg/dL — ABNORMAL HIGH (ref 0–99)
NonHDL: 133.95
Total CHOL/HDL Ratio: 3
Triglycerides: 110 mg/dL (ref 0.0–149.0)
VLDL: 22 mg/dL (ref 0.0–40.0)

## 2021-03-09 LAB — BASIC METABOLIC PANEL
BUN: 15 mg/dL (ref 6–23)
CO2: 28 mEq/L (ref 19–32)
Calcium: 9.3 mg/dL (ref 8.4–10.5)
Chloride: 105 mEq/L (ref 96–112)
Creatinine, Ser: 0.99 mg/dL (ref 0.40–1.50)
GFR: 90.91 mL/min (ref >60.00)
Glucose, Bld: 96 mg/dL (ref 70–99)
Potassium: 4.1 mEq/L (ref 3.5–5.1)
Sodium: 140 mEq/L (ref 135–145)

## 2021-03-09 LAB — CBC WITH DIFFERENTIAL/PLATELET
Basophils Absolute: 0 10*3/uL (ref 0.0–0.1)
Basophils Relative: 0.6 % (ref 0.0–3.0)
Eosinophils Absolute: 0.2 10*3/uL (ref 0.0–0.7)
Eosinophils Relative: 4.7 % (ref 0.0–5.0)
HCT: 43.5 % (ref 39.0–52.0)
Hemoglobin: 14.8 g/dL (ref 13.0–17.0)
Lymphocytes Relative: 27.7 % (ref 12.0–46.0)
Lymphs Abs: 1.4 10*3/uL (ref 0.7–4.0)
MCHC: 34.1 g/dL (ref 30.0–36.0)
MCV: 89.9 fl (ref 78.0–100.0)
Monocytes Absolute: 0.4 10*3/uL (ref 0.1–1.0)
Monocytes Relative: 8.4 % (ref 3.0–12.0)
Neutro Abs: 2.9 10*3/uL (ref 1.4–7.7)
Neutrophils Relative %: 58.6 % (ref 43.0–77.0)
Platelets: 204 10*3/uL (ref 150.0–400.0)
RBC: 4.84 Mil/uL (ref 4.22–5.81)
RDW: 12.8 % (ref 11.5–15.5)
WBC: 5 10*3/uL (ref 4.0–10.5)

## 2021-03-09 LAB — HEPATIC FUNCTION PANEL
ALT: 32 U/L (ref 0–53)
AST: 26 U/L (ref 0–37)
Albumin: 4.5 g/dL (ref 3.5–5.2)
Alkaline Phosphatase: 51 U/L (ref 39–117)
Bilirubin, Direct: 0.1 mg/dL (ref 0.0–0.3)
Total Bilirubin: 0.6 mg/dL (ref 0.2–1.2)
Total Protein: 6.8 g/dL (ref 6.0–8.3)

## 2021-03-09 LAB — TSH: TSH: 2.71 u[IU]/mL (ref 0.35–5.50)

## 2021-03-09 NOTE — Patient Instructions (Signed)
Confirm date of last tetanus  Check on coverage for colonoscopy and let me know if interested in setting up.

## 2021-03-09 NOTE — Progress Notes (Signed)
Established Patient Office Visit  Subjective:  Patient ID: Samuel Ross, male    DOB: 24-Sep-1975  Age: 45 y.o. MRN: [id number]  CC:  Chief Complaint  Patient presents with   Annual Exam    No new concerns     HPI Samuel Ross presents for physical exam.  He has history of GERD, eosinophilic esophagitis, perennial allergic rhinitis.  Generally healthy.  Has history of alopecia and takes low-dose finasteride for that.  No active medical problems.  Health maintenance reviewed:  -He thinks he had tetanus with work 2017 but has not confirmed -No history of hepatitis C screening but low risk -He had colonoscopy over 10 years ago for some rectal bleeding and apparently this showed some hemorrhoids.  No family history of colon cancer -COVID vaccines up-to-date  Family history-his father died age 24 of stroke complications.  His father also had history of A. fib.  He has a sister with A. fib.  Another sister with rheumatoid arthritis.  He has 4 sisters total and no brothers.  Mother with history of diverticulitis and osteoarthritis.  Social history-he is married and has 2 children.  His daughter will be entering 10th grade and son in seventh grade.   Non-smoker.  Works for [employer].  Enjoys playing golf.  Past Medical History:  Diagnosis Date   Allergy    Chicken pox    Colon polyps    Eosinophilic esophagitis    GERD (gastroesophageal reflux disease)     Past Surgical History:  Procedure Laterality Date   none      Family History  Problem Relation Age of Onset   Arthritis Mother    Diverticulitis Mother    Heart disease Father        a fib   Stroke Father    Atrial fibrillation Father    Arthritis Sister 19       rheumatoid arthritis.   Hyperlipidemia Maternal Grandfather    Diabetes Maternal Grandfather    Heart disease Paternal Grandmother    Heart disease Paternal Grandfather    Prostate cancer Paternal Grandfather    Colon cancer Neg Hx    Stomach cancer Neg Hx     Pancreatic cancer Neg Hx    Esophageal cancer Neg Hx     Social History   Socioeconomic History   Marital status: Married    Spouse name: Not on file   Number of children: Not on file   Years of education: Not on file   Highest education level: Not on file  Occupational History   Not on file  Tobacco Use   Smoking status: Former   Smokeless tobacco: Never  Vaping Use   Vaping Use: Never used  Substance and Sexual Activity   Alcohol use: Yes    Comment: 1 beverage daily   Drug use: No   Sexual activity: Not on file  Other Topics Concern   Not on file  Social History Narrative   Not on file   Social Determinants of Health   Financial Resource Strain: Not on file  Food Insecurity: Not on file  Transportation Needs: Not on file  Physical Activity: Not on file  Stress: Not on file  Social Connections: Not on file  Intimate Partner Violence: Not on file    Outpatient Medications Prior to Visit  Medication Sig Dispense Refill   cephALEXin (KEFLEX) 500 MG capsule Take 1 capsule (500 mg total) by mouth 4 (four) times daily. 28 capsule 0  finasteride (PROSCAR) 5 MG tablet TAKE 1/3 TABLET BY MOUTH ONCE A DAY 30 tablet 1   fluticasone (FLOVENT HFA) 220 MCG/ACT inhaler Swallow 2 puffs twice a day.  DO NOT INHALE INTO LUNG.  Rinse mouth after swallows.  Do not eat or drink for 30 minutes prior to or after swallowing med. 1 Inhaler 6   levocetirizine (XYZAL) 5 MG tablet Take 5 mg by mouth as needed.      mometasone (NASONEX) 50 MCG/ACT nasal spray Place 2 sprays into the nose as needed.      montelukast (SINGULAIR) 10 MG tablet Take 10 mg by mouth as needed.      omeprazole (PRILOSEC) 20 MG capsule Take 20 mg by mouth daily.     Facility-Administered Medications Prior to Visit  Medication Dose Route Frequency Provider Last Rate Last Admin   0.9 %  sodium chloride infusion  500 mL Intravenous Continuous Ladene Artist, MD        No Known Allergies  ROS Review of  Systems  Constitutional:  Negative for activity change, appetite change, fatigue and fever.  HENT:  Negative for congestion, ear pain and trouble swallowing.   Eyes:  Negative for pain and visual disturbance.  Respiratory:  Negative for cough, shortness of breath and wheezing.   Cardiovascular:  Negative for chest pain and palpitations.  Gastrointestinal:  Negative for abdominal distention, abdominal pain, blood in stool, constipation, diarrhea, nausea, rectal pain and vomiting.  Genitourinary:  Negative for dysuria, hematuria and testicular pain.  Musculoskeletal:  Negative for arthralgias and joint swelling.  Skin:  Negative for rash.  Neurological:  Negative for dizziness, syncope and headaches.  Hematological:  Negative for adenopathy.  Psychiatric/Behavioral:  Negative for confusion and dysphoric mood.      Objective:    Physical Exam Constitutional:      General: He is not in acute distress.    Appearance: He is well-developed.  HENT:     Head: Normocephalic and atraumatic.     Right Ear: External ear normal.     Left Ear: External ear normal.  Eyes:     Conjunctiva/sclera: Conjunctivae normal.     Pupils: Pupils are equal, round, and reactive to light.  Neck:     Thyroid: No thyromegaly.  Cardiovascular:     Rate and Rhythm: Normal rate and regular rhythm.     Heart sounds: Normal heart sounds. No murmur heard. Pulmonary:     Effort: No respiratory distress.     Breath sounds: No wheezing or rales.  Abdominal:     General: Bowel sounds are normal. There is no distension.     Palpations: Abdomen is soft. There is no mass.     Tenderness: There is no abdominal tenderness. There is no guarding or rebound.  Musculoskeletal:     Cervical back: Normal range of motion and neck supple.     Right lower leg: No edema.     Left lower leg: No edema.  Lymphadenopathy:     Cervical: No cervical adenopathy.  Skin:    Findings: No rash.  Neurological:     Mental Status: He is  alert and oriented to person, place, and time.     Cranial Nerves: No cranial nerve deficit.     Deep Tendon Reflexes: Reflexes normal.    BP 110/64 (BP Location: Left Arm, Patient Position: Sitting, Cuff Size: Normal)   Pulse 74   Temp 97.9 F (36.6 C) (Oral)   Ht '5\' 10"'  (1.778 m)  Wt 198 lb 4.8 oz (89.9 kg)   SpO2 98%   BMI 28.45 kg/m  Wt Readings from Last 3 Encounters:  03/09/21 198 lb 4.8 oz (89.9 kg)  07/14/20 202 lb 14.4 oz (92 kg)  09/16/19 195 lb (88.5 kg)     Health Maintenance Due  Topic Date Due   HIV Screening  Never done   Hepatitis C Screening  Never done   COLONOSCOPY (Pts 45-57yr Insurance coverage will need to be confirmed)  Never done    There are no preventive care reminders to display for this patient.  No results found for: TSH No results found for: WBC, HGB, HCT, MCV, PLT No results found for: NA, K, CHLORIDE, CO2, GLUCOSE, BUN, CREATININE, BILITOT, ALKPHOS, AST, ALT, PROT, ALBUMIN, CALCIUM, ANIONGAP, EGFR, GFR No results found for: CHOL No results found for: HDL No results found for: LDLCALC No results found for: TRIG No results found for: CHOLHDL No results found for: HGBA1C    Assessment & Plan:   Problem List Items Addressed This Visit   None Visit Diagnoses     Physical exam    -  Primary   Relevant Orders   Basic metabolic panel   Lipid panel   CBC with Differential/Platelet   TSH   Hepatic function panel   Hep C Antibody     -Consider setting up screening colonoscopy.  He will check on insurance coverage -Confirm date of last tetanus -Check labs as above including hepatitis C antibody.  He is low risk for hepatitis C.   No orders of the defined types were placed in this encounter.   Follow-up: No follow-ups on file.    BCarolann Littler MD

## 2021-03-10 LAB — HEPATITIS C ANTIBODY
Hepatitis C Ab: NONREACTIVE
SIGNAL TO CUT-OFF: 0.01

## 2021-07-19 DIAGNOSIS — J3081 Allergic rhinitis due to animal (cat) (dog) hair and dander: Secondary | ICD-10-CM | POA: Diagnosis not present

## 2021-07-19 DIAGNOSIS — J301 Allergic rhinitis due to pollen: Secondary | ICD-10-CM | POA: Diagnosis not present

## 2021-07-19 DIAGNOSIS — J3089 Other allergic rhinitis: Secondary | ICD-10-CM | POA: Diagnosis not present

## 2021-07-19 DIAGNOSIS — H1045 Other chronic allergic conjunctivitis: Secondary | ICD-10-CM | POA: Diagnosis not present

## 2021-07-27 ENCOUNTER — Other Ambulatory Visit: Payer: Self-pay

## 2021-07-27 MED ORDER — FINASTERIDE 5 MG PO TABS
ORAL_TABLET | ORAL | 1 refills | Status: DC
Start: 2021-07-27 — End: 2022-09-04

## 2021-08-29 ENCOUNTER — Telehealth: Payer: BC Managed Care – PPO | Admitting: Family Medicine

## 2021-08-29 ENCOUNTER — Other Ambulatory Visit: Payer: Self-pay

## 2021-08-29 ENCOUNTER — Telehealth: Payer: Self-pay | Admitting: Family Medicine

## 2021-08-29 NOTE — Telephone Encounter (Signed)
Patient calling in with respiratory symptoms: Shortness of breath, chest pain, palpitations or other red words send to Triage  Does the patient have a fever over 100, cough, congestion, sore throat, runny nose, lost of taste/smell (please list symptoms that patient has)?bodyaches, chills, temp,ha,st  What date did symptoms start?08-24-2021 (If over 5 days ago, pt may be scheduled for in person visit)  Have you tested for Covid in the last 5 days? No   If yes, was it positive []  OR negative [] ? If positive in the last 5 days, please schedule virtual visit now. If negative, schedule for an in person OV with the next available provider if PCP has no openings. Please also let patient know they will be tested again (follow the script below)  "you will have to arrive 23mins prior to your appt time to be Covid tested. Please park in back of office at the [facility] & call [phone] to let the staff know you have arrived. A staff member will meet you at your car to do a rapid covid test. Once the test has resulted you will be notified by phone of your results to determine if appt will remain an in person visit or be converted to a virtual/phone visit. If you arrive less than 87mins before your appt time, your visit will be automatically converted to virtual & any recommended testing will happen AFTER the visit." Pt has virtual with dr Martinique 08-29-2021 4 pm  THINGS TO REMEMBER  If no availability for virtual visit in office,  please schedule another [place] office  If no availability at another [place] office, please instruct patient that they can schedule an evisit or virtual visit through their mychart account. Visits up to 8pm  patients can be seen in office 5 days after positive COVID test

## 2021-10-28 ENCOUNTER — Encounter: Payer: Self-pay | Admitting: Family Medicine

## 2021-10-28 DIAGNOSIS — Z1211 Encounter for screening for malignant neoplasm of colon: Secondary | ICD-10-CM

## 2021-11-10 ENCOUNTER — Encounter: Payer: Self-pay | Admitting: Gastroenterology

## 2021-11-14 ENCOUNTER — Encounter: Payer: Self-pay | Admitting: Family Medicine

## 2021-11-14 ENCOUNTER — Ambulatory Visit: Payer: BC Managed Care – PPO | Admitting: Family Medicine

## 2021-11-14 VITALS — BP 130/80 | HR 64 | Temp 97.8°F | Ht 70.0 in | Wt 202.7 lb

## 2021-11-14 DIAGNOSIS — S39012A Strain of muscle, fascia and tendon of lower back, initial encounter: Secondary | ICD-10-CM

## 2021-11-14 MED ORDER — NAPROXEN 500 MG PO TABS: 500.0000 mg | ORAL_TABLET | Freq: Two times a day (BID) | ORAL | 0 refills | Status: DC

## 2021-11-14 MED ORDER — METAXALONE 800 MG PO TABS: 800.0000 mg | ORAL_TABLET | Freq: Three times a day (TID) | ORAL | 0 refills | Status: DC | PRN

## 2021-11-14 NOTE — Progress Notes (Signed)
? ?Established Patient Office Visit ? ?Subjective:  ?Patient ID: Samuel Ross, male    DOB: [birth year]  Age: 46 y.o. MRN: [id number] ? ?CC:  ?Chief Complaint  ?Patient presents with  ? Back Pain  ?  Patient complains of back pain, x2 weeks   ? ? ?HPI ?Samuel Ross presents for low back pain for about 2 weeks.  Does not recall specific injury but has been getting up several trees that he took down on his own property and he thinks this is related to the lifting from that.  He had some leftover Relafen and Skelaxin which is helped somewhat.  He is concerned because he is going on a vacation cruise next week.  Pain is bilateral.  Achy pain.  Worse with back flexion.  No radiculitis symptoms.  No numbness.  No weakness.  No appetite or weight changes.  Has had similar back pains in the past. ? ?Past Medical History:  ?Diagnosis Date  ? Allergy   ? Chicken pox   ? Colon polyps   ? Eosinophilic esophagitis   ? GERD (gastroesophageal reflux disease)   ? ? ?Past Surgical History:  ?Procedure Laterality Date  ? none    ? ? ?Family History  ?Problem Relation Age of Onset  ? Arthritis Mother   ? Diverticulitis Mother   ? Heart disease Father   ?     a fib  ? Stroke Father   ? Atrial fibrillation Father   ? Arthritis Sister 101  ?     rheumatoid arthritis.  ? Hyperlipidemia Maternal Grandfather   ? Diabetes Maternal Grandfather   ? Heart disease Paternal Grandmother   ? Heart disease Paternal Grandfather   ? Prostate cancer Paternal Grandfather   ? Colon cancer Neg Hx   ? Stomach cancer Neg Hx   ? Pancreatic cancer Neg Hx   ? Esophageal cancer Neg Hx   ? ? ?Social History  ? ?Socioeconomic History  ? Marital status: Married  ?  Spouse name: Not on file  ? Number of children: Not on file  ? Years of education: Not on file  ? Highest education level: Not on file  ?Occupational History  ? Not on file  ?Tobacco Use  ? Smoking status: Former  ? Smokeless tobacco: Never  ?Vaping Use  ? Vaping Use: Never used  ?Substance and Sexual  Activity  ? Alcohol use: Yes  ?  Comment: 1 beverage daily  ? Drug use: No  ? Sexual activity: Not on file  ?Other Topics Concern  ? Not on file  ?Social History Narrative  ? Not on file  ? ?Social Determinants of Health  ? ?Financial Resource Strain: Low Risk   ? Difficulty of Paying Living Expenses: Not hard at all  ?Food Insecurity: No Food Insecurity  ? Worried About Charity fundraiser in the Last Year: Never true  ? Ran Out of Food in the Last Year: Never true  ?Transportation Needs: Not on file  ?Physical Activity: Sufficiently Active  ? Days of Exercise per Week: 2 days  ? Minutes of Exercise per Session: 150+ min  ?Stress: No Stress Concern Present  ? Feeling of Stress : Only a little  ?Social Connections: Moderately Integrated  ? Frequency of Communication with Friends and Family: Twice a week  ? Frequency of Social Gatherings with Friends and Family: Twice a week  ? Attends Religious Services: More than 4 times per year  ? Active Member of Clubs or Organizations:  No  ? Attends Archivist Meetings: Not on file  ? Marital Status: Married  ?Intimate Partner Violence: Not on file  ? ? ?Outpatient Medications Prior to Visit  ?Medication Sig Dispense Refill  ? finasteride (PROSCAR) 5 MG tablet TAKE 1/3 TABLET BY MOUTH ONCE A DAY 90 tablet 1  ? fluticasone (FLOVENT HFA) 220 MCG/ACT inhaler Swallow 2 puffs twice a day.  DO NOT INHALE INTO LUNG.  Rinse mouth after swallows.  Do not eat or drink for 30 minutes prior to or after swallowing med. 1 Inhaler 6  ? levocetirizine (XYZAL) 5 MG tablet Take 5 mg by mouth as needed.     ? mometasone (NASONEX) 50 MCG/ACT nasal spray Place 2 sprays into the nose as needed.     ? montelukast (SINGULAIR) 10 MG tablet Take 10 mg by mouth as needed.     ? omeprazole (PRILOSEC) 20 MG capsule Take 20 mg by mouth daily.    ? ?Facility-Administered Medications Prior to Visit  ?Medication Dose Route Frequency Provider Last Rate Last Admin  ? 0.9 %  sodium chloride infusion   500 mL Intravenous Continuous Ladene Artist, MD      ? ? ?No Known Allergies ? ?ROS ?Review of Systems  ?Constitutional:  Negative for appetite change, chills, fever and unexpected weight change.  ?Gastrointestinal:  Negative for abdominal pain.  ?Musculoskeletal:  Positive for back pain.  ?Neurological:  Negative for weakness and numbness.  ? ?  ?Objective:  ?  ?Physical Exam ?Vitals reviewed.  ?Constitutional:   ?   Appearance: Normal appearance.  ?Musculoskeletal:  ?   Comments: Straight leg raise are negative bilaterally.  Able to flex back 90 degrees without difficulty.  No spinal tenderness.  ?Neurological:  ?   Mental Status: He is alert.  ?   Comments: Full strength with plantarflexion and dorsiflexion bilaterally.  2+ reflexes ankle and knee bilaterally.  ? ? ?BP 130/80 (BP Location: Left Arm, Patient Position: Sitting, Cuff Size: Normal)   Pulse 64   Temp 97.8 ?F (36.6 ?C) (Oral)   Ht '5\' 10"'$  (1.778 m)   Wt 202 lb 11.2 oz (91.9 kg)   SpO2 97%   BMI 29.08 kg/m?  ?Wt Readings from Last 3 Encounters:  ?11/14/21 202 lb 11.2 oz (91.9 kg)  ?03/09/21 198 lb 4.8 oz (89.9 kg)  ?07/14/20 202 lb 14.4 oz (92 kg)  ? ? ? ?Health Maintenance Due  ?Topic Date Due  ? HIV Screening  Never done  ? COLONOSCOPY (Pts 45-55yr Insurance coverage will need to be confirmed)  Never done  ? COVID-19 Vaccine (4 - Booster for Moderna series) 11/06/2020  ? ? ?There are no preventive care reminders to display for this patient. ? ?Lab Results  ?Component Value Date  ? TSH 2.71 03/09/2021  ? ?Lab Results  ?Component Value Date  ? WBC 5.0 03/09/2021  ? HGB 14.8 03/09/2021  ? HCT 43.5 03/09/2021  ? MCV 89.9 03/09/2021  ? PLT 204.0 03/09/2021  ? ?Lab Results  ?Component Value Date  ? NA 140 03/09/2021  ? K 4.1 03/09/2021  ? CO2 28 03/09/2021  ? GLUCOSE 96 03/09/2021  ? BUN 15 03/09/2021  ? CREATININE 0.99 03/09/2021  ? BILITOT 0.6 03/09/2021  ? ALKPHOS 51 03/09/2021  ? AST 26 03/09/2021  ? ALT 32 03/09/2021  ? PROT 6.8 03/09/2021  ?  ALBUMIN 4.5 03/09/2021  ? CALCIUM 9.3 03/09/2021  ? GFR 90.91 03/09/2021  ? ?Lab Results  ?Component Value  Date  ? CHOL 223 (H) 03/09/2021  ? ?Lab Results  ?Component Value Date  ? HDL 89.20 03/09/2021  ? ?Lab Results  ?Component Value Date  ? LDLCALC 112 (H) 03/09/2021  ? ?Lab Results  ?Component Value Date  ? TRIG 110.0 03/09/2021  ? ?Lab Results  ?Component Value Date  ? CHOLHDL 3 03/09/2021  ? ?No results found for: HGBA1C ? ?  ?Assessment & Plan:  ? ?Problem List Items Addressed This Visit   ?None ?Visit Diagnoses   ? ? Strain of lumbar region, initial encounter    -  Primary  ? ?  ?Suspect musculoskeletal back strain.  Nonfocal neuro exam.  Recommend some extension stretches.  Consider physical therapy if not improving in a couple weeks.  Refill Skelaxin to take every 8 hours as needed for muscle spasm.  Naproxen 500 mg twice daily with food. ? ?Meds ordered this encounter  ?Medications  ? naproxen (NAPROSYN) 500 MG tablet  ?  Sig: Take 1 tablet (500 mg total) by mouth 2 (two) times daily with a meal.  ?  Dispense:  30 tablet  ?  Refill:  0  ? metaxalone (SKELAXIN) 800 MG tablet  ?  Sig: Take 1 tablet (800 mg total) by mouth 3 (three) times daily as needed for muscle spasms.  ?  Dispense:  30 tablet  ?  Refill:  0  ? ? ?Follow-up: No follow-ups on file.  ? ? ?Carolann Littler, MD ?

## 2021-12-19 ENCOUNTER — Ambulatory Visit (AMBULATORY_SURGERY_CENTER): Payer: BC Managed Care – PPO

## 2021-12-19 VITALS — Ht 70.0 in | Wt 195.0 lb

## 2021-12-19 DIAGNOSIS — Z1211 Encounter for screening for malignant neoplasm of colon: Secondary | ICD-10-CM

## 2021-12-19 MED ORDER — NA SULFATE-K SULFATE-MG SULF 17.5-3.13-1.6 GM/177ML PO SOLN: 1.0000 | Freq: Once | ORAL | 0 refills | Status: AC

## 2021-12-19 NOTE — Progress Notes (Signed)
No egg or soy allergy known to patient  ?No issues known to pt with past sedation with any surgeries or procedures ?Patient denies ever being told they had issues or difficulty with intubation  ?No FH of Malignant Hyperthermia ?Pt is not on diet pills ?Pt is not on home 02  ?Pt is not on blood thinners  ?Pt denies issues with constipation at this time; ?No A fib or A flutter ?NO PA's for preps discussed with pt in PV today  ?Discussed with pt there will be an out-of-pocket cost for prep and that varies from $0 to 70 + dollars - pt verbalized understanding  ?Pt instructed to use [email or web address] or GoodRx for a price reduction on prep  ?PV completed over the phone. Pt verified name, DOB, address and insurance during PV today.  ? ?Pt encouraged to call with questions or issues.  ?If pt has My chart, procedure instructions sent via My Chart  ?Insurance confirmed with pt at PV today  ? ?

## 2021-12-27 ENCOUNTER — Encounter: Payer: Self-pay | Admitting: Gastroenterology

## 2021-12-28 ENCOUNTER — Telehealth: Payer: Self-pay | Admitting: Gastroenterology

## 2021-12-28 DIAGNOSIS — Z1211 Encounter for screening for malignant neoplasm of colon: Secondary | ICD-10-CM

## 2021-12-28 MED ORDER — NA SULFATE-K SULFATE-MG SULF 17.5-3.13-1.6 GM/177ML PO SOLN: 1.0000 | Freq: Once | ORAL | 0 refills | Status: AC

## 2021-12-28 NOTE — Telephone Encounter (Signed)
SENT IN SUPREP TO [id number] PER PT REQUEST - PT TO USE [email or web address] FOR COUPON  ?

## 2021-12-28 NOTE — Telephone Encounter (Signed)
Inbound call from patient stating he went to pick up his prep from [facility] and patient stated it was way over priced. [facility] is [facility] advise[facility] ? [facility] [address] ?([phone] ?

## 2021-12-29 MED ORDER — NA SULFATE-K SULFATE-MG SULF 17.5-3.13-1.6 GM/177ML PO SOLN: 1.0000 | Freq: Once | ORAL | 0 refills | Status: AC

## 2021-12-29 NOTE — Telephone Encounter (Signed)
Patient called stating pharmacy has not received prescription, requesting it be resent. Please advise.   ?

## 2021-12-29 NOTE — Telephone Encounter (Signed)
[facility] [address]  ?

## 2021-12-29 NOTE — Addendum Note (Signed)
Addended by: Steva Ready on: 12/29/2021 10:03 AM ? ? Modules accepted: Orders ? ?

## 2022-01-02 ENCOUNTER — Ambulatory Visit (AMBULATORY_SURGERY_CENTER): Payer: BC Managed Care – PPO | Admitting: Gastroenterology

## 2022-01-02 ENCOUNTER — Encounter: Payer: Self-pay | Admitting: Gastroenterology

## 2022-01-02 VITALS — BP 104/62 | HR 55 | Temp 97.7°F | Resp 15 | Ht 70.0 in | Wt 195.0 lb

## 2022-01-02 DIAGNOSIS — Z86010 Personal history of colonic polyps: Secondary | ICD-10-CM | POA: Diagnosis not present

## 2022-01-02 DIAGNOSIS — Z1211 Encounter for screening for malignant neoplasm of colon: Secondary | ICD-10-CM | POA: Diagnosis not present

## 2022-01-02 DIAGNOSIS — K635 Polyp of colon: Secondary | ICD-10-CM

## 2022-01-02 DIAGNOSIS — D128 Benign neoplasm of rectum: Secondary | ICD-10-CM

## 2022-01-02 DIAGNOSIS — K621 Rectal polyp: Secondary | ICD-10-CM | POA: Diagnosis not present

## 2022-01-02 DIAGNOSIS — D123 Benign neoplasm of transverse colon: Secondary | ICD-10-CM

## 2022-01-02 MED ORDER — SODIUM CHLORIDE 0.9 % IV SOLN: 500.0000 mL | Freq: Once | INTRAVENOUS | Status: DC

## 2022-01-02 NOTE — Progress Notes (Signed)
Pt. Reports no change in his medical or surgical history since his pre-visit 12/19/2021. ?

## 2022-01-02 NOTE — Op Note (Addendum)
[place] ?Patient Name: Samuel Ross ?Procedure Date: 01/02/2022 9:39 AM ?MRN: [id number] ?Endoscopist: Ladene Artist , MD ?Age: 46 ?Referring MD:  ?Date of Birth: [birth year] ?Gender: Male ?Account #: [id number] ?Procedure:                Colonoscopy ?Indications:              Surveillance: Personal history of adenomatous  ?                          polyps on last colonoscopy > 5 years ago, One small  ?                          TVA removed in March 2011. ?Medicines:                Monitored Anesthesia Care ?Procedure:                Pre-Anesthesia Assessment: ?                          - Prior to the procedure, a History and Physical  ?                          was performed, and patient medications and  ?                          allergies were reviewed. The patient's tolerance of  ?                          previous anesthesia was also reviewed. The risks  ?                          and benefits of the procedure and the sedation  ?                          options and risks were discussed with the patient.  ?                          All questions were answered, and informed consent  ?                          was obtained. Prior Anticoagulants: The patient has  ?                          taken no previous anticoagulant or antiplatelet  ?                          agents. ASA Grade Assessment: II - A patient with  ?                          mild systemic disease. After reviewing the risks  ?                          and benefits, the patient was deemed in  ?  satisfactory condition to undergo the procedure. ?                          After obtaining informed consent, the colonoscope  ?                          was passed under direct vision. Throughout the  ?                          procedure, the patient's blood pressure, pulse, and  ?                          oxygen saturations were monitored continuously. The  ?                          Olympus CF-[id number] (#[id number])  Colonoscope was  ?                          introduced through the anus and advanced to the the  ?                          cecum, identified by appendiceal orifice and  ?                          ileocecal valve. The ileocecal valve, appendiceal  ?                          orifice, and rectum were photographed. The quality  ?                          of the bowel preparation was excellent. The  ?                          colonoscopy was performed without difficulty. The  ?                          patient tolerated the procedure well. ?Scope In: 9:43:44 AM ?Scope Out: 9:59:09 AM ?Scope Withdrawal Time: 0 hours 13 minutes 19 seconds  ?Total Procedure Duration: 0 hours 15 minutes 25 seconds  ?Findings:                 The perianal and digital rectal examinations were  ?                          normal. ?                          Three sessile polyps were found in the rectum (1 -  ?                          29m) and transverse colon (2 - 7-8 mm). The polyps  ?                          were 5 to 8 mm in size. These polyps were removed  ?  with a cold snare. Resection and retrieval were  ?                          complete. ?                          Internal hemorrhoids were found during  ?                          retroflexion. The hemorrhoids were small and Grade  ?                          I (internal hemorrhoids that do not prolapse). ?                          The exam was otherwise without abnormality on  ?                          direct and retroflexion views. ?Complications:            No immediate complications. Estimated blood loss:  ?                          None. ?Estimated Blood Loss:     Estimated blood loss: none. ?Impression:               - Three 5 to 8 mm polyps in the rectum and in the  ?                          transverse colon, removed with a cold snare.  ?                          Resected and retrieved. ?                          - Internal hemorrhoids. ?                           - The examination was otherwise normal on direct  ?                          and retroflexion views. ?Recommendation:           - Repeat colonoscopy after studies are complete for  ?                          surveillance based on pathology results. ?                          - Patient has a contact number available for  ?                          emergencies. The signs and symptoms of potential  ?                          delayed complications were discussed with the  ?  patient. Return to normal activities tomorrow.  ?                          Written discharge instructions were provided to the  ?                          patient. ?                          - Resume previous diet. ?                          - Continue present medications. ?                          - Await pathology results. ?Ladene Artist, MD ?01/02/2022 10:03:34 AM ?This report has been signed electronically. ?

## 2022-01-02 NOTE — Progress Notes (Signed)
To pacu, VSS. Report to [email or web address] 

## 2022-01-02 NOTE — Progress Notes (Signed)
Called to room to assist during endoscopic procedure.  Patient ID and intended procedure confirmed with present staff. Received instructions for my participation in the procedure from the performing physician.  

## 2022-01-02 NOTE — Progress Notes (Signed)
? ?History & Physical ? ?Primary Care Physician:  Eulas Post, MD ?Primary Gastroenterologist: Lucio Edward, MD ? ?CHIEF COMPLAINT:  Personal history of colon polyps  ? ?HPI: Samuel Ross is a 46 y.o. 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 ? ? ?Past Medical History:  ?Diagnosis Date  ? Chicken pox   ? Colon polyps   ? Eosinophilic esophagitis   ? GERD (gastroesophageal reflux disease)   ? Seasonal allergies   ? ? ?Past Surgical History:  ?Procedure Laterality Date  ? COLONOSCOPY  2011  ? [facility] to bring in results  ? UPPER GASTROINTESTINAL ENDOSCOPY  2018  ? [facility]  ? WISDOM TOOTH EXTRACTION    ? ? ?Prior to Admission medications   ?Medication Sig Start Date End Date Taking? Authorizing Provider  ?finasteride (PROSCAR) 5 MG tablet TAKE 1/3 TABLET BY MOUTH ONCE A DAY 07/27/21  Yes Burchette, Alinda Sierras, MD  ?levocetirizine (XYZAL) 5 MG tablet Take 5 mg by mouth every evening. 03/31/14  Yes [provider]  ?montelukast (SINGULAIR) 10 MG tablet Take 10 mg by mouth at bedtime. 03/31/14  Yes [provider]  ?fluticasone (FLOVENT HFA) 220 MCG/ACT inhaler Swallow 2 puffs twice a day.  DO NOT INHALE INTO LUNG.  Rinse mouth after swallows.  Do not eat or drink for 30 minutes prior to or after swallowing med. ?Patient not taking: Reported on 01/02/2022 09/11/18   Eulas Post, MD  ?mometasone (NASONEX) 50 MCG/ACT nasal spray Place 2 sprays into the nose as needed.  ?Patient not taking: Reported on 01/02/2022    [provider]  ? ? ?Current Outpatient Medications  ?Medication Sig Dispense Refill  ? finasteride (PROSCAR) 5 MG tablet TAKE 1/3 TABLET BY MOUTH ONCE A DAY 90 tablet 1  ? levocetirizine (XYZAL) 5 MG tablet Take 5 mg by mouth every evening.    ? montelukast (SINGULAIR) 10 MG tablet Take 10 mg by mouth at bedtime.    ? fluticasone (FLOVENT HFA) 220 MCG/ACT inhaler Swallow 2 puffs twice a day.  DO NOT INHALE INTO LUNG.   Rinse mouth after swallows.  Do not eat or drink for 30 minutes prior to or after swallowing med. (Patient not taking: Reported on 01/02/2022) 1 Inhaler 6  ? mometasone (NASONEX) 50 MCG/ACT nasal spray Place 2 sprays into the nose as needed.  (Patient not taking: Reported on 01/02/2022)    ? ?Current Facility-Administered Medications  ?Medication Dose Route Frequency Provider Last Rate Last Admin  ? 0.9 %  sodium chloride infusion  500 mL Intravenous Continuous Ladene Artist, MD      ? 0.9 %  sodium chloride infusion  500 mL Intravenous Once Ladene Artist, MD      ? ? ?Allergies as of 01/02/2022  ? (No Known Allergies)  ? ? ?Family History  ?Problem Relation Age of Onset  ? Arthritis Mother   ? Diverticulitis Mother   ? Heart disease Father   ?     a fib  ? Stroke Father   ? Atrial fibrillation Father   ? Arthritis Sister 64  ?     rheumatoid arthritis.  ? Hyperlipidemia Maternal Grandfather   ? Diabetes Maternal Grandfather   ? Heart disease Paternal Grandmother   ? Heart disease Paternal Grandfather   ? Prostate cancer Paternal Grandfather   ? Colon cancer Neg Hx   ? Stomach cancer Neg Hx   ? Pancreatic cancer Neg  Hx   ? Esophageal cancer Neg Hx   ? ? ?Social History  ? ?Socioeconomic History  ? Marital status: Married  ?  Spouse name: Not on file  ? Number of children: Not on file  ? Years of education: Not on file  ? Highest education level: Not on file  ?Occupational History  ? Not on file  ?Tobacco Use  ? Smoking status: Former  ? Smokeless tobacco: Never  ?Vaping Use  ? Vaping Use: Never used  ?Substance and Sexual Activity  ? Alcohol use: Yes  ?  Alcohol/week: 5.0 standard drinks  ?  Types: 5 Standard drinks or equivalent per week  ?  Comment: 1 beverage daily  ? Drug use: No  ? Sexual activity: Not on file  ?Other Topics Concern  ? Not on file  ?Social History Narrative  ? Not on file  ? ?Social Determinants of Health  ? ?Financial Resource Strain: Low Risk   ? Difficulty of Paying Living Expenses: Not  hard at all  ?Food Insecurity: No Food Insecurity  ? Worried About Charity fundraiser in the Last Year: Never true  ? Ran Out of Food in the Last Year: Never true  ?Transportation Needs: Not on file  ?Physical Activity: Sufficiently Active  ? Days of Exercise per Week: 2 days  ? Minutes of Exercise per Session: 150+ min  ?Stress: No Stress Concern Present  ? Feeling of Stress : Only a little  ?Social Connections: Moderately Integrated  ? Frequency of Communication with Friends and Family: Twice a week  ? Frequency of Social Gatherings with Friends and Family: Twice a week  ? Attends Religious Services: More than 4 times per year  ? Active Member of Clubs or Organizations: No  ? Attends Archivist Meetings: Not on file  ? Marital Status: Married  ?Intimate Partner Violence: Not on file  ? ? ?Review of Systems: ? ?All systems reviewed an negative except where noted in HPI. ? ?Gen: Denies any fever, chills, sweats, anorexia, fatigue, weakness, malaise, weight loss, and sleep disorder ?CV: Denies chest pain, angina, palpitations, syncope, orthopnea, PND, peripheral edema, and claudication. ?Resp: Denies dyspnea at rest, dyspnea with exercise, cough, sputum, wheezing, coughing up blood, and pleurisy. ?GI: Denies vomiting blood, jaundice, and fecal incontinence.   Denies dysphagia or odynophagia. ?GU : Denies urinary burning, blood in urine, urinary frequency, urinary hesitancy, nocturnal urination, and urinary incontinence. ?MS: Denies joint pain, limitation of movement, and swelling, stiffness, low back pain, extremity pain. Denies muscle weakness, cramps, atrophy.  ?Derm: Denies rash, itching, dry skin, hives, moles, warts, or unhealing ulcers.  ?Psych: Denies depression, anxiety, memory loss, suicidal ideation, hallucinations, paranoia, and confusion. ?Heme: Denies bruising, bleeding, and enlarged lymph nodes. ?Neuro:  Denies any headaches, dizziness, paresthesias. ?Endo:  Denies any problems with DM,  thyroid, adrenal function. ? ? ?Physical Exam: ?General:  Alert, well-developed, in NAD ?Head:  Normocephalic and atraumatic. ?Eyes:  Sclera clear, no icterus.   Conjunctiva pink. ?Ears:  Normal auditory acuity. ?Mouth:  No deformity or lesions.  ?Neck:  Supple; no masses . ?Lungs:  Clear throughout to auscultation.   No wheezes, crackles, or rhonchi. No acute distress. ?Heart:  Regular rate and rhythm; no murmurs. ?Abdomen:  Soft, nondistended, nontender. No masses, hepatomegaly. No obvious masses.  Normal bowel .    ?Rectal:  Deferred   ?Msk:  Symmetrical without gross deformities.Marland Kitchen ?Pulses:  Normal pulses noted. ?Extremities:  Without edema. ?Neurologic:  Alert and  oriented  x4;  grossly normal neurologically. ?Skin:  Intact without significant lesions or rashes. ?Cervical Nodes:  No significant cervical adenopathy. ?Psych:  Alert and cooperative. Normal mood and affect. ? ? ?Impression / Plan:  ? 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 ? ?Shaquna Geigle T. [facility]  01/02/2022, 9:35 AM ?See Shea Evans, [facility], to contact our on call provider ? ? ?  ?

## 2022-01-02 NOTE — Patient Instructions (Addendum)
Handouts on hemorrhoids and polyps given to patient. ?Await pathology results. ?Resume previous diet and continue present medications. ?Repeat colonoscopy for surveillance will be determined based off of pathology results. ? ? ?YOU HAD AN ENDOSCOPIC PROCEDURE TODAY AT [facility]:   Refer to the procedure report that was given to you for any specific questions about what was found during the examination.  If the procedure report does not answer your questions, please call your gastroenterologist to clarify.  If you requested that your care partner not be given the details of your procedure findings, then the procedure report has been included in a sealed envelope for you to review at your convenience later. ? ?YOU SHOULD EXPECT: Some feelings of bloating in the abdomen. Passage of more gas than usual.  Walking can help get rid of the air that was put into your GI tract during the procedure and reduce the bloating. If you had a lower endoscopy (such as a colonoscopy or flexible sigmoidoscopy) you may notice spotting of blood in your stool or on the toilet paper. If you underwent a bowel prep for your procedure, you may not have a normal bowel movement for a few days. ? ?Please Note:  You might notice some irritation and congestion in your nose or some drainage.  This is from the oxygen used during your procedure.  There is no need for concern and it should clear up in a day or so. ? ?SYMPTOMS TO REPORT IMMEDIATELY: ? ?Following lower endoscopy (colonoscopy or flexible sigmoidoscopy): ? Excessive amounts of blood in the stool ? Significant tenderness or worsening of abdominal pains ? Swelling of the abdomen that is new, acute ? Fever of 100?F or higher ? ?For urgent or emergent issues, a gastroenterologist can be reached at any hour by calling [phone]. ?Do not use MyChart messaging for urgent concerns.  ? ? ?DIET:  We do recommend a small meal at first, but then you may proceed to your regular  diet.  Drink plenty of fluids but you should avoid alcoholic beverages for 24 hours. ? ?ACTIVITY:  You should plan to take it easy for the rest of today and you should NOT DRIVE or use heavy machinery until tomorrow (because of the sedation medicines used during the test).   ? ?FOLLOW UP: ?Our staff will call the number listed on your records 48-72 hours following your procedure to check on you and address any questions or concerns that you may have regarding the information given to you following your procedure. If we do not reach you, we will leave a message.  We will attempt to reach you two times.  During this call, we will ask if you have developed any symptoms of COVID 19. If you develop any symptoms (ie: fever, flu-like symptoms, shortness of breath, cough etc.) before then, please call [phone].  If you test positive for Covid 19 in the 2 weeks post procedure, please call and report this information to [place].   ? ?If any biopsies were taken you will be contacted by phone or by letter within the next 1-3 weeks.  Please call us at [phone] if you have not heard about the biopsies in 3 weeks.  ? ? ?SIGNATURES/CONFIDENTIALITY: ?You and/or your care partner have signed paperwork which will be entered into your electronic medical record.  These signatures attest to the fact that that the information above on your After Visit Summary has been reviewed and is understood.  Full responsibility of  the confidentiality of this discharge information lies with you and/or your care-partner.  ?

## 2022-01-04 ENCOUNTER — Telehealth: Payer: Self-pay | Admitting: *Deleted

## 2022-01-04 NOTE — Telephone Encounter (Signed)
?  Follow up Call- ? ? ?  01/02/2022  ?  9:10 AM  ?Call back number  ?Post procedure Call Back phone  # [phone]  ?Permission to leave phone message Yes  ?  ? ?Patient questions: ? ?Do you have a fever, pain , or abdominal swelling? No. ?Pain Score  0 * ? ?Have you tolerated food without any problems? Yes.   ? ?Have you been able to return to your normal activities? Yes.   ? ?Do you have any questions about your discharge instructions: ?Diet   No. ?Medications  No. ?Follow up visit  No. ? ?Do you have questions or concerns about your Care? No. ? ?Actions: ?* If pain score is 4 or above: ?No action needed, pain <4. ? ? ?

## 2022-01-16 ENCOUNTER — Encounter: Payer: Self-pay | Admitting: Gastroenterology

## 2022-01-23 ENCOUNTER — Encounter: Payer: Self-pay | Admitting: Dermatology

## 2022-01-23 ENCOUNTER — Ambulatory Visit: Payer: BC Managed Care – PPO | Admitting: Dermatology

## 2022-01-23 DIAGNOSIS — D485 Neoplasm of uncertain behavior of skin: Secondary | ICD-10-CM

## 2022-01-23 DIAGNOSIS — L738 Other specified follicular disorders: Secondary | ICD-10-CM

## 2022-01-23 DIAGNOSIS — D224 Melanocytic nevi of scalp and neck: Secondary | ICD-10-CM

## 2022-01-23 DIAGNOSIS — D239 Other benign neoplasm of skin, unspecified: Secondary | ICD-10-CM

## 2022-01-23 DIAGNOSIS — L821 Other seborrheic keratosis: Secondary | ICD-10-CM

## 2022-01-23 DIAGNOSIS — D2372 Other benign neoplasm of skin of left lower limb, including hip: Secondary | ICD-10-CM

## 2022-01-23 NOTE — Patient Instructions (Signed)

## 2022-02-10 ENCOUNTER — Encounter: Payer: Self-pay | Admitting: Dermatology

## 2022-08-19 ENCOUNTER — Other Ambulatory Visit: Payer: Self-pay | Admitting: Family Medicine

## 2022-09-04 ENCOUNTER — Ambulatory Visit: Payer: BC Managed Care – PPO | Admitting: Family Medicine

## 2022-09-04 ENCOUNTER — Encounter: Payer: Self-pay | Admitting: Family Medicine

## 2022-09-04 VITALS — BP 120/80 | HR 75 | Temp 98.1°F | Ht 70.0 in | Wt 205.6 lb

## 2022-09-04 DIAGNOSIS — J3089 Other allergic rhinitis: Secondary | ICD-10-CM

## 2022-09-04 DIAGNOSIS — L659 Nonscarring hair loss, unspecified: Secondary | ICD-10-CM

## 2022-09-04 MED ORDER — LEVOCETIRIZINE DIHYDROCHLORIDE 5 MG PO TABS: 5.0000 mg | ORAL_TABLET | Freq: Every evening | ORAL | 3 refills | Status: DC

## 2022-09-04 MED ORDER — FLUTICASONE PROPIONATE HFA 220 MCG/ACT IN AERO: INHALATION_SPRAY | RESPIRATORY_TRACT | 3 refills | Status: AC

## 2022-09-04 MED ORDER — FINASTERIDE 5 MG PO TABS: ORAL_TABLET | ORAL | 3 refills | Status: DC

## 2022-09-04 MED ORDER — MONTELUKAST SODIUM 10 MG PO TABS: 10.0000 mg | ORAL_TABLET | Freq: Every day | ORAL | 3 refills | Status: DC

## 2022-09-04 NOTE — Patient Instructions (Signed)
Consider setting up complete physical at some point later this year.

## 2022-09-04 NOTE — Progress Notes (Signed)
Established Patient Office Visit  Subjective   Patient ID: Samuel Ross, male    DOB: 1975/09/05  Age: 47 y.o. MRN: [id number]  Chief Complaint  Patient presents with   Medication Consultation    HPI   Samuel Ross is seen for medication follow-up.  He needs several refills.  He has history of perennial allergic rhinitis.  They have a poodle mix who sleeps with them at night he thinks a lot of his perennial allergies may be related to the dog.  He currently takes Singulair, Nasonex, and Xyzal.  Symptoms fairly well-controlled with the above.  He has generalized alopecia and takes low-dose finasteride which seems to help.  Requesting refills.  Infrequently uses Flovent  Past Medical History:  Diagnosis Date   Chicken pox    Colon polyps    Eosinophilic esophagitis    GERD (gastroesophageal reflux disease)    Seasonal allergies    Past Surgical History:  Procedure Laterality Date   COLONOSCOPY  2011   [facility] to bring in results   UPPER GASTROINTESTINAL ENDOSCOPY  2018   [facility]   WISDOM TOOTH EXTRACTION      reports that he has quit smoking. He has never used smokeless tobacco. He reports current alcohol use of about 5.0 standard drinks of alcohol per week. He reports that he does not use drugs. family history includes Arthritis in his mother; Arthritis (age of onset: 21) in his sister; Atrial fibrillation in his father; Diabetes in his maternal grandfather; Diverticulitis in his mother; Heart disease in his father, paternal grandfather, and paternal grandmother; Hyperlipidemia in his maternal grandfather; Prostate cancer in his paternal grandfather; Stroke in his father. No Known Allergies  Review of Systems  Constitutional:  Negative for chills and fever.  Respiratory:  Negative for cough and shortness of breath.   Cardiovascular:  Negative for chest pain.      Objective:     BP 120/80 (BP Location: Left Arm, Patient Position: Sitting, Cuff Size: Large)   Pulse 75    Temp 98.1 F (36.7 C) (Oral)   Ht '5\' 10"'$  (1.778 m)   Wt 205 lb 9.6 oz (93.3 kg)   SpO2 98%   BMI 29.50 kg/m  BP Readings from Last 3 Encounters:  09/04/22 120/80  01/02/22 104/62  11/14/21 130/80   Wt Readings from Last 3 Encounters:  09/04/22 205 lb 9.6 oz (93.3 kg)  01/02/22 195 lb (88.5 kg)  12/19/21 195 lb (88.5 kg)      Physical Exam Vitals reviewed.  Constitutional:      Appearance: Normal appearance.  HENT:     Right Ear: Tympanic membrane normal.     Left Ear: Tympanic membrane normal.  Cardiovascular:     Rate and Rhythm: Normal rate and regular rhythm.     Heart sounds: No murmur heard. Pulmonary:     Effort: Pulmonary effort is normal.     Breath sounds: Normal breath sounds. No wheezing or rales.  Neurological:     Mental Status: He is alert.      No results found for any visits on 09/04/22.    The 10-year ASCVD risk score (Arnett DK, et al., 2019) is: 1.5%    Assessment & Plan:   #1 perennial allergic rhinitis.  Refill several medications including Nasonex, levo-cetirizine, and Singulair  #2 alopecia.  Stable.  On low-dose finasteride.  Refill finasteride for 1 year  Set up physical for later this year. No follow-ups on file.    Carolann Littler,  MD

## 2022-09-07 DIAGNOSIS — J3089 Other allergic rhinitis: Secondary | ICD-10-CM | POA: Diagnosis not present

## 2022-09-07 DIAGNOSIS — J3081 Allergic rhinitis due to animal (cat) (dog) hair and dander: Secondary | ICD-10-CM | POA: Diagnosis not present

## 2022-09-07 DIAGNOSIS — J301 Allergic rhinitis due to pollen: Secondary | ICD-10-CM | POA: Diagnosis not present

## 2022-09-07 DIAGNOSIS — H1045 Other chronic allergic conjunctivitis: Secondary | ICD-10-CM | POA: Diagnosis not present

## 2023-01-03 ENCOUNTER — Ambulatory Visit (INDEPENDENT_AMBULATORY_CARE_PROVIDER_SITE_OTHER): Payer: BC Managed Care – PPO | Admitting: Family Medicine

## 2023-01-03 ENCOUNTER — Encounter: Payer: Self-pay | Admitting: Family Medicine

## 2023-01-03 VITALS — BP 118/68 | HR 70 | Temp 98.4°F | Wt 197.0 lb

## 2023-01-03 DIAGNOSIS — S46812A Strain of other muscles, fascia and tendons at shoulder and upper arm level, left arm, initial encounter: Secondary | ICD-10-CM

## 2023-01-03 DIAGNOSIS — M549 Dorsalgia, unspecified: Secondary | ICD-10-CM | POA: Diagnosis not present

## 2023-01-03 MED ORDER — MELOXICAM 7.5 MG PO TABS: 7.5000 mg | ORAL_TABLET | Freq: Every day | ORAL | 0 refills | Status: AC

## 2023-01-03 MED ORDER — CYCLOBENZAPRINE HCL 5 MG PO TABS: 5.0000 mg | ORAL_TABLET | Freq: Three times a day (TID) | ORAL | 0 refills | Status: AC | PRN

## 2023-01-03 NOTE — Progress Notes (Signed)
   Established Patient Office Visit   Subjective  Patient ID: Samuel Ross, male    DOB: 06-14-1976  Age: 47 y.o. MRN: [id number]  Chief Complaint  Patient presents with   Back Pain    Knot under lt shoulder blade, over a wk. Gets better as day goes along but in the morning is when it is worse. Has tried the massage gun, and it did not help, also heat, and does a stretch. It comes back each morning.     Patient is a 47 year old male followed by Dr. Caryl Never and seen for acute concern.  Patient endorses pain underneath left scapula x 1.5 weeks.  States woke up with a 9/10 pain underneath the shoulder blade.  Pain eases throughout the day but returns in the morning.  Patient is a slide sleeper.  May do some tossing and turning.  Denies increased activity.  Plays golf regularly.  Tried stretching arm across body and certain bending/twisting motions cause pain.  Also tried heat and Theragun.  Back Pain      Review of Systems  Musculoskeletal:  Positive for back pain.   Negative unless stated above    Objective:     BP 118/68 (BP Location: Right Arm, Patient Position: Sitting, Cuff Size: Large)   Pulse 70   Temp 98.4 F (36.9 C) (Oral)   Wt 197 lb (89.4 kg)   SpO2 94%   BMI 28.27 kg/m    Physical Exam Constitutional:      Appearance: Normal appearance.  HENT:     Head: Normocephalic and atraumatic.  Cardiovascular:     Rate and Rhythm: Normal rate and regular rhythm.     Heart sounds: Normal heart sounds.  Pulmonary:     Effort: Pulmonary effort is normal.     Breath sounds: Normal breath sounds.  Musculoskeletal:        General: Tenderness present.     Cervical back: Normal.     Thoracic back: Normal.     Lumbar back: Normal.       Back:     Comments: TTP of medial edge of left scapula.  FROM of UEs.  Left trapezius muscle tightness.  Neurological:     Mental Status: He is alert and oriented to person, place, and time.      No results found for any visits on  01/03/23.    Assessment & Plan:  Upper back pain on left side -     Cyclobenzaprine HCl; Take 1 tablet (5 mg total) by mouth 3 (three) times daily as needed for muscle spasms.  Dispense: 30 tablet; Refill: 0 -     Meloxicam; Take 1 tablet (7.5 mg total) by mouth daily.  Dispense: 30 tablet; Refill: 0  Strain of left subscapularis muscle, initial encounter -     Cyclobenzaprine HCl; Take 1 tablet (5 mg total) by mouth 3 (three) times daily as needed for muscle spasms.  Dispense: 30 tablet; Refill: 0  Etiology of muscle strain unclear.  Causes including rolling over in bed, playing golf, other normal activities.  Continue supportive care including heat, stretching, massage, topical analgesics, NSAIDs or Tylenol.  Rx for muscle relaxer and Mobic sent to pharmacy.  Advised on likely duration of symptoms.  Return if symptoms worsen or fail to improve.   Deeann Saint, MD

## 2023-01-03 NOTE — Patient Instructions (Signed)
A prescription for Flexeril (a muscle relaxer) was sent to your pharmacy.  You can try taking this at night to see how it makes you feel.  If it does not cause drowsiness you can take it during the day.  A prescription for Mobic was also sent to the pharmacy.  This is an anti-inflammatory medication to help reduce pain and inflammation.  You can also use topical medications such as Biofreeze, IcyHot, Aspercreme for your symptoms.

## 2023-01-04 ENCOUNTER — Ambulatory Visit: Payer: BC Managed Care – PPO | Admitting: Family Medicine

## 2023-04-19 ENCOUNTER — Other Ambulatory Visit: Payer: Self-pay

## 2023-04-19 MED ORDER — MONTELUKAST SODIUM 10 MG PO TABS: 10.0000 mg | ORAL_TABLET | Freq: Every day | ORAL | 0 refills | Status: DC

## 2023-08-24 ENCOUNTER — Other Ambulatory Visit: Payer: Self-pay | Admitting: Family Medicine

## 2023-09-17 DIAGNOSIS — J301 Allergic rhinitis due to pollen: Secondary | ICD-10-CM | POA: Diagnosis not present

## 2023-09-17 DIAGNOSIS — J3081 Allergic rhinitis due to animal (cat) (dog) hair and dander: Secondary | ICD-10-CM | POA: Diagnosis not present

## 2023-09-17 DIAGNOSIS — H1045 Other chronic allergic conjunctivitis: Secondary | ICD-10-CM | POA: Diagnosis not present

## 2023-09-17 DIAGNOSIS — J3089 Other allergic rhinitis: Secondary | ICD-10-CM | POA: Diagnosis not present

## 2023-09-18 ENCOUNTER — Other Ambulatory Visit: Payer: Self-pay | Admitting: Family Medicine

## 2023-10-02 ENCOUNTER — Ambulatory Visit: Payer: BC Managed Care – PPO | Admitting: Family Medicine

## 2023-10-02 ENCOUNTER — Encounter: Payer: Self-pay | Admitting: Family Medicine

## 2023-10-02 VITALS — BP 120/76 | HR 87 | Temp 98.1°F | Ht 70.0 in | Wt 206.1 lb

## 2023-10-02 DIAGNOSIS — Z1283 Encounter for screening for malignant neoplasm of skin: Secondary | ICD-10-CM

## 2023-10-02 DIAGNOSIS — M7022 Olecranon bursitis, left elbow: Secondary | ICD-10-CM

## 2023-10-02 NOTE — Progress Notes (Signed)
Established Patient Office Visit  Subjective   Patient ID: Samuel Ross, male    DOB: [birth year]  Age: 48 y.o. MRN: [id number]  Chief Complaint  Patient presents with   Joint Swelling    Fluid left elbow, year ago,     HPI   Chun is seen with left elbow swelling past 4 days or so.  He does recall a couple of mild trauma events during the past year but does not recall any specific trauma within the past week or so.  He does rest his elbows frequently at work on the computer.  No warmth.  No redness.  No history of gout.  No associated pain.  Full range of motion.  He is also referring general dermatology evaluation.  He has increased risk of skin cancer with his skin type.  His previous dermatologist retired.  Past Medical History:  Diagnosis Date   Chicken pox    Colon polyps    Eosinophilic esophagitis    GERD (gastroesophageal reflux disease)    Seasonal allergies    Past Surgical History:  Procedure Laterality Date   COLONOSCOPY  2011   [facility] to bring in results   UPPER GASTROINTESTINAL ENDOSCOPY  2018   [facility]   WISDOM TOOTH EXTRACTION      reports that he has quit smoking. He has never used smokeless tobacco. He reports current alcohol use of about 5.0 standard drinks of alcohol per week. He reports that he does not use drugs. family history includes Arthritis in his mother; Arthritis (age of onset: 69) in his sister; Atrial fibrillation in his father; Diabetes in his maternal grandfather; Diverticulitis in his mother; Heart disease in his father, paternal grandfather, and paternal grandmother; Hyperlipidemia in his maternal grandfather; Prostate cancer in his paternal grandfather; Stroke in his father. No Known Allergies  Review of Systems  Constitutional:  Negative for chills and fever.      Objective:     BP 120/76 (BP Location: Left Arm, Patient Position: Sitting, Cuff Size: Large)   Pulse 87   Temp 98.1 F (36.7 C) (Oral)   Ht 5\' 10"  (1.778 m)    Wt 206 lb 1.6 oz (93.5 kg)   SpO2 97%   BMI 29.57 kg/m  BP Readings from Last 3 Encounters:  10/02/23 120/76  01/03/23 118/68  09/04/22 120/80   Wt Readings from Last 3 Encounters:  10/02/23 206 lb 1.6 oz (93.5 kg)  01/03/23 197 lb (89.4 kg)  09/04/22 205 lb 9.6 oz (93.3 kg)      Physical Exam Vitals reviewed.  Constitutional:      General: He is not in acute distress.    Appearance: He is not ill-appearing.  Cardiovascular:     Rate and Rhythm: Normal rate and regular rhythm.  Musculoskeletal:     Comments: Left elbow reveals small olecranon bursa effusion.  No warmth.  No erythema.  Full range of motion.  No bony tenderness.  Neurological:     Mental Status: He is alert.      No results found for any visits on 10/02/23.    The 10-year ASCVD risk score (Arnett DK, et al., 2019) is: 1.7%    Assessment & Plan:    Small left olecranon bursa effusion.  Probably related to applying pressure on elbows frequently during the day.  No evidence for inflammation such as erythema or warmth.  Effusion is very small at this point and we have not recommended aspiration.  He will try some  icing and avoid pressure especially on the elbow.  He is aware this may take several days to resolve.  Follow-up for any erythema, warmth, or progressive effusion.  Dermatology referral placed-for good basic skin check per patient request.  No follow-ups on file.    Evelena Peat, MD

## 2023-11-22 ENCOUNTER — Other Ambulatory Visit: Payer: Self-pay | Admitting: Family Medicine

## 2023-12-15 ENCOUNTER — Other Ambulatory Visit: Payer: Self-pay | Admitting: Family Medicine

## 2024-01-04 DIAGNOSIS — D225 Melanocytic nevi of trunk: Secondary | ICD-10-CM | POA: Diagnosis not present

## 2024-01-04 DIAGNOSIS — L814 Other melanin hyperpigmentation: Secondary | ICD-10-CM | POA: Diagnosis not present

## 2024-01-04 DIAGNOSIS — L821 Other seborrheic keratosis: Secondary | ICD-10-CM | POA: Diagnosis not present

## 2024-01-04 DIAGNOSIS — D2372 Other benign neoplasm of skin of left lower limb, including hip: Secondary | ICD-10-CM | POA: Diagnosis not present

## 2024-04-03 ENCOUNTER — Other Ambulatory Visit: Payer: Self-pay | Admitting: Family Medicine
